# Patient Record
Sex: Female | Born: 1960
Health system: Southern US, Community
[De-identification: ages and names within clinical notes are randomized; demographics above are authoritative.]

## PROBLEM LIST (undated history)

## (undated) DIAGNOSIS — D219 Benign neoplasm of connective and other soft tissue, unspecified: Secondary | ICD-10-CM

## (undated) DIAGNOSIS — J302 Other seasonal allergic rhinitis: Secondary | ICD-10-CM

## (undated) HISTORY — PX: WISDOM TOOTH EXTRACTION: SHX21

## (undated) HISTORY — DX: Benign neoplasm of connective and other soft tissue, unspecified: D21.9

## (undated) HISTORY — PX: TUBAL LIGATION: SHX77

## (undated) HISTORY — DX: Other seasonal allergic rhinitis: J30.2

---

## 1997-08-27 ENCOUNTER — Other Ambulatory Visit: Admission: RE | Admit: 1997-08-27 | Discharge: 1997-08-27 | Payer: Self-pay | Admitting: Obstetrics and Gynecology

## 1998-05-25 ENCOUNTER — Emergency Department (HOSPITAL_COMMUNITY): Admission: EM | Admit: 1998-05-25 | Discharge: 1998-05-25 | Payer: Self-pay

## 1999-05-24 ENCOUNTER — Other Ambulatory Visit: Admission: RE | Admit: 1999-05-24 | Discharge: 1999-05-24 | Payer: Self-pay | Admitting: Obstetrics and Gynecology

## 2001-03-06 ENCOUNTER — Other Ambulatory Visit: Admission: RE | Admit: 2001-03-06 | Discharge: 2001-03-06 | Payer: Self-pay | Admitting: Obstetrics and Gynecology

## 2002-08-25 ENCOUNTER — Other Ambulatory Visit: Admission: RE | Admit: 2002-08-25 | Discharge: 2002-08-25 | Payer: Self-pay | Admitting: Obstetrics and Gynecology

## 2012-03-12 ENCOUNTER — Ambulatory Visit (INDEPENDENT_AMBULATORY_CARE_PROVIDER_SITE_OTHER): Payer: BC Managed Care – PPO | Admitting: Obstetrics and Gynecology

## 2012-03-12 ENCOUNTER — Encounter: Payer: Self-pay | Admitting: Obstetrics and Gynecology

## 2012-03-12 VITALS — BP 106/72 | HR 78 | Resp 16 | Ht 68.0 in | Wt 214.0 lb

## 2012-03-12 DIAGNOSIS — Z124 Encounter for screening for malignant neoplasm of cervix: Secondary | ICD-10-CM

## 2012-03-12 DIAGNOSIS — D219 Benign neoplasm of connective and other soft tissue, unspecified: Secondary | ICD-10-CM | POA: Insufficient documentation

## 2012-03-12 DIAGNOSIS — Z01419 Encounter for gynecological examination (general) (routine) without abnormal findings: Secondary | ICD-10-CM

## 2012-03-12 NOTE — Progress Notes (Signed)
ANNUAL GYNECOLOGIC EXAMINATION   Destiny Frost is a 51 y.o. female, G4P4, who presents for an annual exam. The patient is doing well.  She has not been seen in 5 years.  She has some insomnia but very few hot flashes.  She does have some vaginal dryness.  She complains of urinary urgency but no incontinence.   History   Social History  . Marital Status: Married    Spouse Name: N/A    Number of Children: N/A  . Years of Education: N/A   Social History Main Topics  . Smoking status: Never Smoker   . Smokeless tobacco: Never Used  . Alcohol Use: No  . Drug Use: No  . Sexually Active: Yes -- Female partner(s)    Birth Control/ Protection: Surgical   Other Topics Concern  . None   Social History Narrative  . None    Menstrual cycle:   LMP: Patient's last menstrual period was 03/11/2012.             The following portions of the patient's history were reviewed and updated as appropriate: allergies, current medications, past family history, past medical history, past social history, past surgical history and problem list.  Review of Systems Pertinent items are noted in HPI. Breast:Negative for breast lump,nipple discharge or nipple retraction Gastrointestinal: Negative for abdominal pain, change in bowel habits or rectal bleeding Urinary:negative   Objective:    BP 106/72  Pulse 78  Resp 16  Ht 5\' 8"  (1.727 m)  Wt 214 lb (97.07 kg)  BMI 32.54 kg/m2  LMP 03/11/2012    Weight:  Wt Readings from Last 1 Encounters:  03/12/12 214 lb (97.07 kg)          BMI: Body mass index is 32.54 kg/(m^2).  General Appearance: Alert, appropriate appearance for age. No acute distress HEENT: Grossly normal Neck / Thyroid: Supple, no masses, nodes or enlargement Lungs: clear to auscultation bilaterally Back: No CVA tenderness Breast Exam: No masses or nodes.No dimpling, nipple retraction or discharge. Cardiovascular: Regular rate and rhythm. S1, S2, no murmur Gastrointestinal: Soft,  non-tender, no masses or organomegaly  ++++++++++++++++++++++++++++++++++++++++++++++++++++++++  Pelvic Exam: External genitalia: normal general appearance Vaginal: normal without tenderness, induration or masses. Relaxation: Yes Cervix: normal appearance Adnexa: normal bimanual exam Uterus: normal size, shape, and consistency Rectovaginal: normal rectal, no masses  ++++++++++++++++++++++++++++++++++++++++++++++++++++++++  Lymphatic Exam: Non-palpable nodes in neck, clavicular, axillary, or inguinal regions Neurologic: Normal speech, no tremor  Psychiatric: Alert and oriented, appropriate affect.  Assessment:    Normal gyn exam   Overweight or obese: Yes   Pelvic relaxation: Yes  Few menopausal symptoms   Plan:    mammogram pap smear return annually or prn Contraception:bilateral tubal ligation    Medications prescribed: none  STD screen request: No   The updated Pap smear screening guidelines were discussed with the patient. The patient requested that I obtain a Pap smear: Yes.  Kegel exercises discussed: Yes.  Proper diet and regular exercise were reviewed.  Annual mammograms recommended starting at age 54. Proper breast care was discussed.  Screening colonoscopy is recommended beginning at age 32.  Regular health maintenance was reviewed.  Sleep hygiene was discussed.  Adequate calcium and vitamin D intake was emphasized.  Leonard Schwartz M.D.    Regular Periods: no "Since last Jan. Cycles have been off. Mammogram: yes  Monthly Breast Ex.: yes Exercise: yes  Tetanus < 10 years: yes Seatbelts: yes  NI. Bladder Functn.: yes Abuse at home: no  Daily BM's: yes Stressful Work: no  Healthy Diet: yes Sigmoid-Colonoscopy: Never  Calcium: no Medical problems this year: None per pt    LAST PAP:pt is unsure possible 2007 WNL  Contraception: BTL  Mammogram:  01/31/2012 'WNL"  PCP: No PCP  PMH: No Changes  FMH: No Changes  Last Bone Scan:  Never

## 2012-03-14 LAB — PAP IG W/ RFLX HPV ASCU

## 2012-11-15 ENCOUNTER — Other Ambulatory Visit: Payer: Self-pay | Admitting: Neurological Surgery

## 2012-11-15 DIAGNOSIS — M47816 Spondylosis without myelopathy or radiculopathy, lumbar region: Secondary | ICD-10-CM

## 2012-11-19 ENCOUNTER — Ambulatory Visit
Admission: RE | Admit: 2012-11-19 | Discharge: 2012-11-19 | Disposition: A | Discharge status: Home / Self Care | Payer: BC Managed Care – PPO | Source: Ambulatory Visit | Attending: Neurological Surgery | Admitting: Neurological Surgery

## 2012-11-19 VITALS — BP 131/65 | HR 53

## 2012-11-19 DIAGNOSIS — M47816 Spondylosis without myelopathy or radiculopathy, lumbar region: Secondary | ICD-10-CM

## 2012-11-19 DIAGNOSIS — M5126 Other intervertebral disc displacement, lumbar region: Secondary | ICD-10-CM

## 2012-11-19 MED ORDER — IOHEXOL 180 MG/ML  SOLN
1.0000 mL | Freq: Once | INTRAMUSCULAR | Status: AC | PRN
  Administered 2012-11-19: 1 mL via EPIDURAL

## 2012-11-19 MED ORDER — METHYLPREDNISOLONE ACETATE 40 MG/ML INJ SUSP (RADIOLOG
120.0000 mg | Freq: Once | INTRAMUSCULAR | Status: AC
  Administered 2012-11-19: 120 mg via EPIDURAL

## 2012-12-05 LAB — HM COLONOSCOPY

## 2012-12-13 LAB — HM COLONOSCOPY

## 2014-03-02 ENCOUNTER — Encounter: Payer: Self-pay | Admitting: Obstetrics and Gynecology

## 2014-05-01 HISTORY — PX: MOHS SURGERY: SUR867

## 2016-06-20 DIAGNOSIS — D2271 Melanocytic nevi of right lower limb, including hip: Secondary | ICD-10-CM | POA: Diagnosis not present

## 2016-06-20 DIAGNOSIS — D225 Melanocytic nevi of trunk: Secondary | ICD-10-CM | POA: Diagnosis not present

## 2016-06-20 DIAGNOSIS — D2230 Melanocytic nevi of unspecified part of face: Secondary | ICD-10-CM | POA: Diagnosis not present

## 2016-06-20 DIAGNOSIS — Z85828 Personal history of other malignant neoplasm of skin: Secondary | ICD-10-CM | POA: Diagnosis not present

## 2016-08-29 DIAGNOSIS — Z6832 Body mass index (BMI) 32.0-32.9, adult: Secondary | ICD-10-CM | POA: Diagnosis not present

## 2016-08-29 DIAGNOSIS — Z1231 Encounter for screening mammogram for malignant neoplasm of breast: Secondary | ICD-10-CM | POA: Diagnosis not present

## 2016-08-29 DIAGNOSIS — Z124 Encounter for screening for malignant neoplasm of cervix: Secondary | ICD-10-CM | POA: Diagnosis not present

## 2016-08-29 DIAGNOSIS — Z01419 Encounter for gynecological examination (general) (routine) without abnormal findings: Secondary | ICD-10-CM | POA: Diagnosis not present

## 2016-08-29 LAB — HM PAP SMEAR: HM PAP: NEGATIVE

## 2016-10-02 DIAGNOSIS — Z1231 Encounter for screening mammogram for malignant neoplasm of breast: Secondary | ICD-10-CM | POA: Diagnosis not present

## 2016-10-17 DIAGNOSIS — R922 Inconclusive mammogram: Secondary | ICD-10-CM | POA: Diagnosis not present

## 2016-10-17 DIAGNOSIS — N6002 Solitary cyst of left breast: Secondary | ICD-10-CM | POA: Diagnosis not present

## 2016-10-17 LAB — HM MAMMOGRAPHY

## 2016-10-18 DIAGNOSIS — D485 Neoplasm of uncertain behavior of skin: Secondary | ICD-10-CM | POA: Diagnosis not present

## 2016-10-18 DIAGNOSIS — C44619 Basal cell carcinoma of skin of left upper limb, including shoulder: Secondary | ICD-10-CM | POA: Diagnosis not present

## 2016-11-08 DIAGNOSIS — C44619 Basal cell carcinoma of skin of left upper limb, including shoulder: Secondary | ICD-10-CM | POA: Diagnosis not present

## 2016-12-05 ENCOUNTER — Encounter: Payer: Self-pay | Admitting: Podiatry

## 2016-12-05 ENCOUNTER — Ambulatory Visit (INDEPENDENT_AMBULATORY_CARE_PROVIDER_SITE_OTHER): Payer: BLUE CROSS/BLUE SHIELD | Admitting: Podiatry

## 2016-12-05 DIAGNOSIS — R21 Rash and other nonspecific skin eruption: Secondary | ICD-10-CM

## 2016-12-05 DIAGNOSIS — B353 Tinea pedis: Secondary | ICD-10-CM

## 2016-12-05 MED ORDER — TRIAMCINOLONE ACETONIDE 0.025 % EX OINT: 1.0000 "application " | TOPICAL_OINTMENT | Freq: Two times a day (BID) | CUTANEOUS | 0 refills | Status: DC

## 2016-12-05 MED ORDER — TERBINAFINE HCL 250 MG PO TABS: 250.0000 mg | ORAL_TABLET | Freq: Every day | ORAL | 0 refills | Status: DC

## 2016-12-05 NOTE — Progress Notes (Signed)
   Subjective:    Patient ID: Destiny Frost, female    DOB: Dec 04, 1960, 56 y.o.   MRN: [id number]  HPI  56 year old female presents the also concerns of a blister on the bottom of her foot with a right side worse than left which does H. She has noticed his little blisters form the bottom of her feet. She said that she may also have athlete's foot. She did see a dermatologist had tests done and she was told "it was nothing" however she states that the areas to hurt in the blisters forming it does itch. She states that it does crack, bleeding at times. She has been using over-the-counter hydrocortisone cream which is been helping last couple of days. She has no other concerns today.  Review of Systems  All other systems reviewed and are negative.      Objective:   Physical Exam General: AAO x3, NAD  Dermatological: On the plantar aspect of the right foot on the midfoot centrally is an area of annular hyperkeratotic tissue with dry skin overlying the area. There is also small erythematous areas on the plantar aspect of the foot which subjectively to age. There is no open sores or drainage identified today. The right fifth toenail does appear to be dystrophic, discolored with yellow discoloration. There is no open lesions of the foot otherwise.  Vascular: Dorsalis Pedis artery and Posterior Tibial artery pedal pulses are 2/4 bilateral with immedate capillary fill time.  There is no pain with calf compression, swelling, warmth, erythema.   Neruologic: Grossly intact via light touch bilateral.  Protective threshold with Semmes Wienstein monofilament intact to all pedal sites bilateral.  Musculoskeletal: No gross boney pedal deformities bilateral. No pain, crepitus, or limitation noted with foot and ankle range of motion bilateral. Muscular strength 5/5 in all groups tested bilateral.  Gait: Unassisted, Nonantalgic.     Assessment & Plan:  56 year old female with skin rash right foot likely tinea  pedis -Treatment options discussed including all alternatives, risks, and complications -Etiology of symptoms were discussed -Prescribed 2 weeks of Lamisil. Discussed side effects the medication. -Prescribed triamcinolone cream as well -Discussed measures to change shoes on a daily basis and keep his showers and she was cleaned. -Discussed possible long-term Lamisil for the right fifth digit toenail however due to adductovarus the toe this is more from damage and believe as opposed to just straight fungus. She may consider treatment options in the future for this. -RTC 3-4 weeks or sooner if needed.  Celesta Gentile, DPM

## 2016-12-19 DIAGNOSIS — D225 Melanocytic nevi of trunk: Secondary | ICD-10-CM | POA: Diagnosis not present

## 2016-12-19 DIAGNOSIS — Z808 Family history of malignant neoplasm of other organs or systems: Secondary | ICD-10-CM | POA: Diagnosis not present

## 2016-12-19 DIAGNOSIS — D220 Melanocytic nevi of lip: Secondary | ICD-10-CM | POA: Diagnosis not present

## 2016-12-19 DIAGNOSIS — Z85828 Personal history of other malignant neoplasm of skin: Secondary | ICD-10-CM | POA: Diagnosis not present

## 2017-01-02 ENCOUNTER — Encounter: Payer: Self-pay | Admitting: Podiatry

## 2017-01-02 ENCOUNTER — Ambulatory Visit (INDEPENDENT_AMBULATORY_CARE_PROVIDER_SITE_OTHER): Payer: BLUE CROSS/BLUE SHIELD | Admitting: Podiatry

## 2017-01-02 DIAGNOSIS — B351 Tinea unguium: Secondary | ICD-10-CM | POA: Diagnosis not present

## 2017-01-02 DIAGNOSIS — B353 Tinea pedis: Secondary | ICD-10-CM

## 2017-01-02 DIAGNOSIS — R21 Rash and other nonspecific skin eruption: Secondary | ICD-10-CM

## 2017-01-02 MED ORDER — KETOCONAZOLE 2 % EX CREA: 1.0000 "application " | TOPICAL_CREAM | Freq: Every day | CUTANEOUS | 2 refills | Status: DC

## 2017-01-02 NOTE — Progress Notes (Signed)
Subjective: 56 year old female presents the office today for follow-up evaluation of rash, likely tinea pedis the right foot. She finished her course of Lamisil and she states that she has not noticed any blisters or bumps form. She still gets some occasional pinching and she's been using a steroid cream. She denies any swelling or redness trephine she denies any open sores. She states that 3 weeks ago she was in [place] and she got bit by fire ants and this is slowly been improving. She did not seek treatment at that point. Denies any systemic complaints such as fevers, chills, nausea, vomiting. No acute changes since last appointment, and no other complaints at this time.   Objective: AAO x3, NAD DP/PT pulses palpable bilaterally, CRT less than 3 seconds Small little bumps or present to the back of the ankle metopic the foot although minimal from where she got bit by fire ants. There is no drainage or swelling or signs of infection. To the plantar aspect of the foot on the arch continues to be as small area dry skin which is peeling with resolving tinea pedis. There is no blistering present. There is no swelling. Is no open lesions. The right fifth digit toenail disc appeared dystrophic, discolored with yellow to brown discoloration. This is been ongoing and she was 56 years old. There is no edema, erythema, drainage past medical clinical signs of infection present. No open lesions or pre-ulcerative lesions.  No pain with calf compression, swelling, warmth, erythema  Assessment: Tinea pedis which is resolving, skin rash  Plan: -All treatment options discussed with the patient including all alternatives, risks, complications.  -At this point would hold off on any further oral Lamisil. I prescribed topical ketoconazole cream to help finish clearing the infection. -Debrided the right fifth digit toenail this was sent for cultures/pathology to [facility] labs -Follow-up after nail culture. Discuss possible  continuation of Lamisil if there is fungus although I still believe this is a component of nail damage. Discussed possible urea cream if she wishes if negative. -Patient encouraged to call the office with any questions, concerns, change in symptoms.   Celesta Gentile, DPM

## 2017-01-12 ENCOUNTER — Telehealth: Payer: Self-pay | Admitting: *Deleted

## 2017-01-12 DIAGNOSIS — Z79899 Other long term (current) drug therapy: Secondary | ICD-10-CM

## 2017-01-12 NOTE — Telephone Encounter (Addendum)
-----   Message from [name], DPM sent at 01/12/2017 12:55 PM EDT ----- + fungus- please let her know. We can do 3 months of the lamisil. She was only on a short course before but will need to do CBC and LFT prior. I still think there is a component of damage to it so medication is not a guarantee to resolve it but should help. Please let her know. Thank you. Left message informing pt to call for culture results.01/18/2017-Pt called for lab results. I informed pt of Dr. Leigh Aurora review of results and orders, return in 4-5 weeks for retest. Pt states understanding and orders sent to [facility] at [facility], and lamisil to [facility].

## 2017-01-18 ENCOUNTER — Encounter: Payer: Self-pay | Admitting: Family Medicine

## 2017-01-18 MED ORDER — TERBINAFINE HCL 250 MG PO TABS: 250.0000 mg | ORAL_TABLET | Freq: Every day | ORAL | 0 refills | Status: DC

## 2017-01-25 DIAGNOSIS — Z79899 Other long term (current) drug therapy: Secondary | ICD-10-CM | POA: Diagnosis not present

## 2017-01-26 LAB — CBC WITH DIFFERENTIAL/PLATELET
BASOS: 1 %
Basophils Absolute: 0 10*3/uL (ref 0.0–0.2)
EOS (ABSOLUTE): 0.1 10*3/uL (ref 0.0–0.4)
EOS: 2 %
HEMOGLOBIN: 13.6 g/dL (ref 11.1–15.9)
Hematocrit: 40 % (ref 34.0–46.6)
IMMATURE GRANULOCYTES: 0 %
Immature Grans (Abs): 0 10*3/uL (ref 0.0–0.1)
LYMPHS ABS: 1.6 10*3/uL (ref 0.7–3.1)
Lymphs: 28 %
MCH: 30.2 pg (ref 26.6–33.0)
MCHC: 34 g/dL (ref 31.5–35.7)
MCV: 89 fL (ref 79–97)
MONOCYTES: 8 %
MONOS ABS: 0.4 10*3/uL (ref 0.1–0.9)
Neutrophils Absolute: 3.5 10*3/uL (ref 1.4–7.0)
Neutrophils: 61 %
Platelets: 233 10*3/uL (ref 150–379)
RBC: 4.5 x10E6/uL (ref 3.77–5.28)
RDW: 13.4 % (ref 12.3–15.4)
WBC: 5.6 10*3/uL (ref 3.4–10.8)

## 2017-01-26 LAB — HEPATIC FUNCTION PANEL
ALBUMIN: 4.9 g/dL (ref 3.5–5.5)
ALK PHOS: 57 IU/L (ref 39–117)
ALT: 15 IU/L (ref 0–32)
AST: 18 IU/L (ref 0–40)
BILIRUBIN, DIRECT: 0.11 mg/dL (ref 0.00–0.40)
Bilirubin Total: 0.5 mg/dL (ref 0.0–1.2)
TOTAL PROTEIN: 7.6 g/dL (ref 6.0–8.5)

## 2017-01-30 ENCOUNTER — Telehealth: Payer: Self-pay | Admitting: *Deleted

## 2017-01-30 NOTE — Telephone Encounter (Addendum)
-----   Message from [name], DPM sent at 01/30/2017  3:07 PM EDT ----- Val- Please let her know that her blood work is normal and it is OK to continue Lamisil. We will do this for a total of 3 months. Thanks. Left message informing pt of Dr. Leigh Aurora review of results and orders.

## 2017-03-08 ENCOUNTER — Encounter: Payer: Self-pay | Admitting: Podiatry

## 2017-05-09 ENCOUNTER — Telehealth: Payer: Self-pay | Admitting: *Deleted

## 2017-05-09 DIAGNOSIS — Z79899 Other long term (current) drug therapy: Secondary | ICD-10-CM

## 2017-05-09 NOTE — Telephone Encounter (Signed)
Pt states she thought she was to have lab

## 2017-05-15 NOTE — Telephone Encounter (Signed)
Pt called for location of [facility].

## 2017-05-15 NOTE — Telephone Encounter (Signed)
I spoke with pt and she states she had her labs performed in [facility] previously. I told pt she could have them performed there again. Pt asked for the phone to the [place] closest to our office, I gave [phone].

## 2017-05-16 DIAGNOSIS — Z79899 Other long term (current) drug therapy: Secondary | ICD-10-CM | POA: Diagnosis not present

## 2017-05-17 LAB — CBC WITH DIFFERENTIAL/PLATELET
BASOS ABS: 0 10*3/uL (ref 0.0–0.2)
Basos: 0 %
EOS (ABSOLUTE): 0.1 10*3/uL (ref 0.0–0.4)
Eos: 1 %
HEMOGLOBIN: 12.8 g/dL (ref 11.1–15.9)
Hematocrit: 38.4 % (ref 34.0–46.6)
IMMATURE GRANS (ABS): 0 10*3/uL (ref 0.0–0.1)
Immature Granulocytes: 0 %
LYMPHS ABS: 1.6 10*3/uL (ref 0.7–3.1)
LYMPHS: 34 %
MCH: 30.2 pg (ref 26.6–33.0)
MCHC: 33.3 g/dL (ref 31.5–35.7)
MCV: 91 fL (ref 79–97)
Monocytes Absolute: 0.5 10*3/uL (ref 0.1–0.9)
Monocytes: 10 %
NEUTROS PCT: 55 %
Neutrophils Absolute: 2.6 10*3/uL (ref 1.4–7.0)
Platelets: 228 10*3/uL (ref 150–379)
RBC: 4.24 x10E6/uL (ref 3.77–5.28)
RDW: 13.3 % (ref 12.3–15.4)
WBC: 4.8 10*3/uL (ref 3.4–10.8)

## 2017-05-17 LAB — HEPATIC FUNCTION PANEL
ALT: 16 IU/L (ref 0–32)
AST: 20 IU/L (ref 0–40)
Albumin: 4.8 g/dL (ref 3.5–5.5)
Alkaline Phosphatase: 65 IU/L (ref 39–117)
Bilirubin Total: 0.6 mg/dL (ref 0.0–1.2)
Bilirubin, Direct: 0.15 mg/dL (ref 0.00–0.40)
Total Protein: 7.2 g/dL (ref 6.0–8.5)

## 2017-05-21 ENCOUNTER — Telehealth: Payer: Self-pay | Admitting: *Deleted

## 2017-05-21 NOTE — Telephone Encounter (Signed)
-----   Message from [name], DPM sent at 05/18/2017  4:36 PM EST ----- Blood work is normal- can continue Lamisil. Please let her know. She should finish a total of 3 months and if there is still issue after to schedule a follow-up or if she has any issues.

## 2017-05-21 NOTE — Telephone Encounter (Signed)
I informed pt of Dr. Wagoner's review of results and orders. 

## 2017-06-14 DIAGNOSIS — H5203 Hypermetropia, bilateral: Secondary | ICD-10-CM | POA: Diagnosis not present

## 2017-06-14 DIAGNOSIS — H52223 Regular astigmatism, bilateral: Secondary | ICD-10-CM | POA: Diagnosis not present

## 2017-06-14 DIAGNOSIS — H524 Presbyopia: Secondary | ICD-10-CM | POA: Diagnosis not present

## 2017-06-15 DIAGNOSIS — Z23 Encounter for immunization: Secondary | ICD-10-CM | POA: Diagnosis not present

## 2017-07-04 DIAGNOSIS — N3001 Acute cystitis with hematuria: Secondary | ICD-10-CM | POA: Diagnosis not present

## 2017-08-14 DIAGNOSIS — M5431 Sciatica, right side: Secondary | ICD-10-CM | POA: Diagnosis not present

## 2017-08-21 DIAGNOSIS — M5431 Sciatica, right side: Secondary | ICD-10-CM | POA: Diagnosis not present

## 2017-08-30 DIAGNOSIS — M5431 Sciatica, right side: Secondary | ICD-10-CM | POA: Diagnosis not present

## 2017-09-10 DIAGNOSIS — M5431 Sciatica, right side: Secondary | ICD-10-CM | POA: Diagnosis not present

## 2017-10-10 DIAGNOSIS — M9903 Segmental and somatic dysfunction of lumbar region: Secondary | ICD-10-CM | POA: Diagnosis not present

## 2017-10-10 DIAGNOSIS — M5442 Lumbago with sciatica, left side: Secondary | ICD-10-CM | POA: Diagnosis not present

## 2017-10-10 DIAGNOSIS — M9905 Segmental and somatic dysfunction of pelvic region: Secondary | ICD-10-CM | POA: Diagnosis not present

## 2017-10-10 DIAGNOSIS — M25552 Pain in left hip: Secondary | ICD-10-CM | POA: Diagnosis not present

## 2017-10-11 DIAGNOSIS — M25552 Pain in left hip: Secondary | ICD-10-CM | POA: Diagnosis not present

## 2017-10-11 DIAGNOSIS — M9905 Segmental and somatic dysfunction of pelvic region: Secondary | ICD-10-CM | POA: Diagnosis not present

## 2017-10-11 DIAGNOSIS — M9903 Segmental and somatic dysfunction of lumbar region: Secondary | ICD-10-CM | POA: Diagnosis not present

## 2017-10-11 DIAGNOSIS — M5442 Lumbago with sciatica, left side: Secondary | ICD-10-CM | POA: Diagnosis not present

## 2017-10-15 DIAGNOSIS — M5442 Lumbago with sciatica, left side: Secondary | ICD-10-CM | POA: Diagnosis not present

## 2017-10-15 DIAGNOSIS — M25552 Pain in left hip: Secondary | ICD-10-CM | POA: Diagnosis not present

## 2017-10-15 DIAGNOSIS — M9905 Segmental and somatic dysfunction of pelvic region: Secondary | ICD-10-CM | POA: Diagnosis not present

## 2017-10-15 DIAGNOSIS — M9903 Segmental and somatic dysfunction of lumbar region: Secondary | ICD-10-CM | POA: Diagnosis not present

## 2017-10-16 DIAGNOSIS — M9903 Segmental and somatic dysfunction of lumbar region: Secondary | ICD-10-CM | POA: Diagnosis not present

## 2017-10-16 DIAGNOSIS — M5442 Lumbago with sciatica, left side: Secondary | ICD-10-CM | POA: Diagnosis not present

## 2017-10-16 DIAGNOSIS — M9905 Segmental and somatic dysfunction of pelvic region: Secondary | ICD-10-CM | POA: Diagnosis not present

## 2017-10-16 DIAGNOSIS — M25552 Pain in left hip: Secondary | ICD-10-CM | POA: Diagnosis not present

## 2017-10-18 DIAGNOSIS — M9905 Segmental and somatic dysfunction of pelvic region: Secondary | ICD-10-CM | POA: Diagnosis not present

## 2017-10-18 DIAGNOSIS — M9903 Segmental and somatic dysfunction of lumbar region: Secondary | ICD-10-CM | POA: Diagnosis not present

## 2017-10-18 DIAGNOSIS — M25552 Pain in left hip: Secondary | ICD-10-CM | POA: Diagnosis not present

## 2017-10-18 DIAGNOSIS — M5442 Lumbago with sciatica, left side: Secondary | ICD-10-CM | POA: Diagnosis not present

## 2017-10-22 DIAGNOSIS — M9903 Segmental and somatic dysfunction of lumbar region: Secondary | ICD-10-CM | POA: Diagnosis not present

## 2017-10-22 DIAGNOSIS — M25552 Pain in left hip: Secondary | ICD-10-CM | POA: Diagnosis not present

## 2017-10-22 DIAGNOSIS — M5442 Lumbago with sciatica, left side: Secondary | ICD-10-CM | POA: Diagnosis not present

## 2017-10-22 DIAGNOSIS — M9905 Segmental and somatic dysfunction of pelvic region: Secondary | ICD-10-CM | POA: Diagnosis not present

## 2017-10-22 NOTE — Progress Notes (Signed)
[facility] [address] [place], [place] [address] Phone: [phone] Subjective:    I'm seeing this patient by the request  of:  Martinique, Betty G, MD   CC: Left-sided back pain  DXI:[id number]  Destiny Frost is a 57 y.o. female coming in with complaint of left-sided back pain.  Seems to be more in the buttocks region patient has had it for multiple months.  Patient did have back pain on the contralateral side and appeared to be more of a piriformis syndrome.  This was many years ago.  Started having worsening symptoms.  Worse with sitting.  Some mild radiation down the leg.  Has been seeing a chiropractor that seems to be improving.     Past Medical History:  Diagnosis Date  . Fibroid    Past Surgical History:  Procedure Laterality Date  . TUBAL LIGATION    . WISDOM TOOTH EXTRACTION     Social History   Socioeconomic History  . Marital status: Married    Spouse name: Not on file  . Number of children: Not on file  . Years of education: Not on file  . Highest education level: Not on file  Occupational History  . Not on file  Social Needs  . Financial resource strain: Not on file  . Food insecurity:    Worry: Not on file    Inability: Not on file  . Transportation needs:    Medical: Not on file    Non-medical: Not on file  Tobacco Use  . Smoking status: Never Smoker  . Smokeless tobacco: Never Used  Substance and Sexual Activity  . Alcohol use: No  . Drug use: No  . Sexual activity: Yes    Partners: Male    Birth control/protection: Surgical  Lifestyle  . Physical activity:    Days per week: Not on file    Minutes per session: Not on file  . Stress: Not on file  Relationships  . Social connections:    Talks on phone: Not on file    Gets together: Not on file    Attends religious service: Not on file    Active member of club or organization: Not on file    Attends meetings of clubs or organizations: Not on file   Relationship status: Not on file  Other Topics Concern  . Not on file  Social History Narrative  . Not on file   Allergies  Allergen Reactions  . Codeine    Family History  Problem Relation Age of Onset  . Cancer Father   . Hypertension Mother   . Cancer Mother        SKIN     Past medical history, social, surgical and family history all reviewed in electronic medical record.  No pertanent information unless stated regarding to the chief complaint.   Review of Systems:Review of systems updated and as accurate as of 10/23/17  No headache, visual changes, nausea, vomiting, diarrhea, constipation, dizziness, abdominal pain, skin rash, fevers, chills, night sweats, weight loss, swollen lymph nodes, body aches, joint swelling, muscle aches, chest pain, shortness of breath, mood changes.   Objective  Blood pressure 116/74, pulse 80, height 5\' 8"  (1.727 m), weight 218 lb (98.9 kg), SpO2 98 %. Systems examined below as of 10/23/17   General: No apparent distress alert and oriented x3 mood and affect normal, dressed appropriately.  HEENT: Pupils equal, extraocular movements intact  Respiratory: Patient's speak in full sentences and  does not appear short of breath  Cardiovascular: No lower extremity edema, non tender, no erythema  Skin: Warm dry intact with no signs of infection or rash on extremities or on axial skeleton.  Abdomen: Soft nontender  Neuro: Cranial nerves II through XII are intact, neurovascularly intact in all extremities with 2+ DTRs and 2+ pulses.  Lymph: No lymphadenopathy of posterior or anterior cervical chain or axillae bilaterally.  Gait normal with good balance and coordination.  MSK:  Non tender with full range of motion and good stability and symmetric strength and tone of shoulders, elbows, wrist, hip, knee and ankles bilaterally.  Back Exam:  Inspection: Loss of lordosis Motion: Flexion 35 deg, Extension 25 deg, Side Bending to 35 deg bilaterally,  Rotation  to 25 deg bilaterally  SLR laying: Negative  XSLR laying: Negative  Palpable tenderness: Tender to palpation the paraspinal musculature lumbar spine but more over the piriformis. FABER: Positive on the left.  Severe tightness of the piriformis on the left compared to the right Sensory change: Gross sensation intact to all lumbar and sacral dermatomes.  Reflexes: 2+ at both patellar tendons, 2+ at achilles tendons, Babinski's downgoing.  Strength at foot  Plantar-flexion: 5/5 Dorsi-flexion: 5/5 Eversion: 5/5 Inversion: 5/5  Leg strength  Quad: 5/5 Hamstring: 5/5 Hip flexor: 5/5 Hip abductors: 4/5  Gait unremarkable.  [id number]; 15 additional minutes spent for Therapeutic exercises as stated in above notes.  This included exercises focusing on stretching, strengthening, with significant focus on eccentric aspects.   Long term goals include an improvement in range of motion, strength, endurance as well as avoiding reinjury. Patient's frequency would include in 1-2 times a day, 3-5 times a week for a duration of 6-12 weeks.Low back exercises that included:  Pelvic tilt/bracing instruction to focus on control of the pelvic girdle and lower abdominal muscles  Glute strengthening exercises, focusing on proper firing of the glutes without engaging the low back muscles Proper stretching techniques for maximum relief for the hamstrings, hip flexors, low back and some rotation where tolerated   Proper technique shown and discussed handout in great detail with ATC.  All questions were discussed and answered.      Impression and Recommendations:     This case required medical decision making of moderate complexity.      Note: This dictation was prepared with Dragon dictation along with smaller phrase technology. Any transcriptional errors that result from this process are unintentional.

## 2017-10-23 ENCOUNTER — Ambulatory Visit (INDEPENDENT_AMBULATORY_CARE_PROVIDER_SITE_OTHER): Payer: BLUE CROSS/BLUE SHIELD | Admitting: Family Medicine

## 2017-10-23 ENCOUNTER — Encounter

## 2017-10-23 ENCOUNTER — Encounter: Payer: Self-pay | Admitting: Family Medicine

## 2017-10-23 DIAGNOSIS — M5410 Radiculopathy, site unspecified: Secondary | ICD-10-CM | POA: Insufficient documentation

## 2017-10-23 DIAGNOSIS — G5702 Lesion of sciatic nerve, left lower limb: Secondary | ICD-10-CM

## 2017-10-23 DIAGNOSIS — M25552 Pain in left hip: Secondary | ICD-10-CM | POA: Diagnosis not present

## 2017-10-23 DIAGNOSIS — M9903 Segmental and somatic dysfunction of lumbar region: Secondary | ICD-10-CM | POA: Diagnosis not present

## 2017-10-23 DIAGNOSIS — M5442 Lumbago with sciatica, left side: Secondary | ICD-10-CM | POA: Diagnosis not present

## 2017-10-23 DIAGNOSIS — M9905 Segmental and somatic dysfunction of pelvic region: Secondary | ICD-10-CM | POA: Diagnosis not present

## 2017-10-23 MED ORDER — GABAPENTIN 100 MG PO CAPS: 200.0000 mg | ORAL_CAPSULE | Freq: Every day | ORAL | 3 refills | Status: DC

## 2017-10-23 NOTE — Assessment & Plan Note (Signed)
Piriformis Syndrome  Using an anatomical model, reviewed with the patient the structures involved and how they related to diagnosis. The patient indicated understanding.   The patient was given a handout from Dr. Arne Cleveland book "The Sports Medicine Patient Advisor" describing the anatomy and rehabilitation of the following condition: Piriformis Syndrome  Also given a handout with more extensive Piriformis stretching, hip flexor and abductor strengthening, ham stretching  Rec deep massage, explained self-massage with ball Patient does not make improvement will consider possible formal physical therapy, injections, and possible osteopathic manipulation.

## 2017-10-23 NOTE — Patient Instructions (Signed)
Good to see you  Piriformis syndrome Ice 20 minutes 2 times daily. Usually after activity and before bed. Exercises 3 times a week.  Gabapentin 200mg  at night Tennis ball in back left pocket with sitting  See me again in 4 weeks

## 2017-10-25 DIAGNOSIS — M9903 Segmental and somatic dysfunction of lumbar region: Secondary | ICD-10-CM | POA: Diagnosis not present

## 2017-10-25 DIAGNOSIS — M5442 Lumbago with sciatica, left side: Secondary | ICD-10-CM | POA: Diagnosis not present

## 2017-10-25 DIAGNOSIS — M9905 Segmental and somatic dysfunction of pelvic region: Secondary | ICD-10-CM | POA: Diagnosis not present

## 2017-10-25 DIAGNOSIS — M25552 Pain in left hip: Secondary | ICD-10-CM | POA: Diagnosis not present

## 2017-10-31 DIAGNOSIS — M9903 Segmental and somatic dysfunction of lumbar region: Secondary | ICD-10-CM | POA: Diagnosis not present

## 2017-10-31 DIAGNOSIS — M9905 Segmental and somatic dysfunction of pelvic region: Secondary | ICD-10-CM | POA: Diagnosis not present

## 2017-10-31 DIAGNOSIS — M5442 Lumbago with sciatica, left side: Secondary | ICD-10-CM | POA: Diagnosis not present

## 2017-10-31 DIAGNOSIS — M5431 Sciatica, right side: Secondary | ICD-10-CM | POA: Diagnosis not present

## 2017-10-31 DIAGNOSIS — M25552 Pain in left hip: Secondary | ICD-10-CM | POA: Diagnosis not present

## 2017-11-05 DIAGNOSIS — M25552 Pain in left hip: Secondary | ICD-10-CM | POA: Diagnosis not present

## 2017-11-05 DIAGNOSIS — M5442 Lumbago with sciatica, left side: Secondary | ICD-10-CM | POA: Diagnosis not present

## 2017-11-05 DIAGNOSIS — M9903 Segmental and somatic dysfunction of lumbar region: Secondary | ICD-10-CM | POA: Diagnosis not present

## 2017-11-05 DIAGNOSIS — M9905 Segmental and somatic dysfunction of pelvic region: Secondary | ICD-10-CM | POA: Diagnosis not present

## 2017-11-06 DIAGNOSIS — M9905 Segmental and somatic dysfunction of pelvic region: Secondary | ICD-10-CM | POA: Diagnosis not present

## 2017-11-06 DIAGNOSIS — M25552 Pain in left hip: Secondary | ICD-10-CM | POA: Diagnosis not present

## 2017-11-06 DIAGNOSIS — M5442 Lumbago with sciatica, left side: Secondary | ICD-10-CM | POA: Diagnosis not present

## 2017-11-06 DIAGNOSIS — M9903 Segmental and somatic dysfunction of lumbar region: Secondary | ICD-10-CM | POA: Diagnosis not present

## 2017-11-08 DIAGNOSIS — M5442 Lumbago with sciatica, left side: Secondary | ICD-10-CM | POA: Diagnosis not present

## 2017-11-08 DIAGNOSIS — M9905 Segmental and somatic dysfunction of pelvic region: Secondary | ICD-10-CM | POA: Diagnosis not present

## 2017-11-08 DIAGNOSIS — M25552 Pain in left hip: Secondary | ICD-10-CM | POA: Diagnosis not present

## 2017-11-08 DIAGNOSIS — M9903 Segmental and somatic dysfunction of lumbar region: Secondary | ICD-10-CM | POA: Diagnosis not present

## 2017-11-12 DIAGNOSIS — M9903 Segmental and somatic dysfunction of lumbar region: Secondary | ICD-10-CM | POA: Diagnosis not present

## 2017-11-12 DIAGNOSIS — M5442 Lumbago with sciatica, left side: Secondary | ICD-10-CM | POA: Diagnosis not present

## 2017-11-12 DIAGNOSIS — M9905 Segmental and somatic dysfunction of pelvic region: Secondary | ICD-10-CM | POA: Diagnosis not present

## 2017-11-12 DIAGNOSIS — M25552 Pain in left hip: Secondary | ICD-10-CM | POA: Diagnosis not present

## 2017-11-13 DIAGNOSIS — M5431 Sciatica, right side: Secondary | ICD-10-CM | POA: Diagnosis not present

## 2017-11-14 DIAGNOSIS — M25552 Pain in left hip: Secondary | ICD-10-CM | POA: Diagnosis not present

## 2017-11-14 DIAGNOSIS — M5442 Lumbago with sciatica, left side: Secondary | ICD-10-CM | POA: Diagnosis not present

## 2017-11-14 DIAGNOSIS — M9905 Segmental and somatic dysfunction of pelvic region: Secondary | ICD-10-CM | POA: Diagnosis not present

## 2017-11-14 DIAGNOSIS — M9903 Segmental and somatic dysfunction of lumbar region: Secondary | ICD-10-CM | POA: Diagnosis not present

## 2017-11-19 DIAGNOSIS — M5442 Lumbago with sciatica, left side: Secondary | ICD-10-CM | POA: Diagnosis not present

## 2017-11-19 DIAGNOSIS — M9903 Segmental and somatic dysfunction of lumbar region: Secondary | ICD-10-CM | POA: Diagnosis not present

## 2017-11-19 DIAGNOSIS — M25552 Pain in left hip: Secondary | ICD-10-CM | POA: Diagnosis not present

## 2017-11-19 DIAGNOSIS — M9905 Segmental and somatic dysfunction of pelvic region: Secondary | ICD-10-CM | POA: Diagnosis not present

## 2017-11-20 ENCOUNTER — Ambulatory Visit (INDEPENDENT_AMBULATORY_CARE_PROVIDER_SITE_OTHER): Payer: BLUE CROSS/BLUE SHIELD | Admitting: Sports Medicine

## 2017-11-20 ENCOUNTER — Ambulatory Visit: Payer: BLUE CROSS/BLUE SHIELD | Admitting: Family Medicine

## 2017-11-20 ENCOUNTER — Encounter: Payer: Self-pay | Admitting: Sports Medicine

## 2017-11-20 VITALS — BP 112/82 | HR 65 | Ht 68.0 in | Wt 212.8 lb

## 2017-11-20 DIAGNOSIS — M4726 Other spondylosis with radiculopathy, lumbar region: Secondary | ICD-10-CM

## 2017-11-20 DIAGNOSIS — M24552 Contracture, left hip: Secondary | ICD-10-CM

## 2017-11-20 DIAGNOSIS — M5410 Radiculopathy, site unspecified: Secondary | ICD-10-CM

## 2017-11-20 DIAGNOSIS — R29898 Other symptoms and signs involving the musculoskeletal system: Secondary | ICD-10-CM | POA: Diagnosis not present

## 2017-11-20 MED ORDER — METHYLPREDNISOLONE ACETATE 80 MG/ML IJ SUSP
80.0000 mg | Freq: Once | INTRAMUSCULAR | Status: AC
  Administered 2017-11-20: 80 mg via INTRAMUSCULAR

## 2017-11-20 MED ORDER — GABAPENTIN 300 MG PO CAPS
300.0000 mg | ORAL_CAPSULE | Freq: Three times a day (TID) | ORAL | 2 refills | Status: DC
Start: 2017-11-20 — End: 2017-12-11

## 2017-11-20 MED ORDER — KETOROLAC TROMETHAMINE 60 MG/2ML IM SOLN
60.0000 mg | Freq: Once | INTRAMUSCULAR | Status: AC
  Administered 2017-11-20: 60 mg via INTRAMUSCULAR

## 2017-11-20 NOTE — Progress Notes (Signed)
[name]. Musette Kisamore, [place] at [facility] [place] - 57 y.o. female MRN [id number]  Date of birth: June 11, 1960  Visit Date: 11/20/2017  PCP: Martinique, Betty G, MD   Referred by: Martinique, Betty G, MD  Scribe(s) for today's visit: Josepha Pigg, CMA  SUBJECTIVE:  Destiny Frost is here for Initial Assessment (L gluteal pain) Referred directly by Dr. Jola Baptist, DC  11/20/2017: Her L gluteal pain symptoms INITIALLY: Began mid April 2019 and she has hx of similar issue.  Described as moderate nagging aching, radiating to the L LE. Occasionally the pain will radiate to the L heel. She did notice sharp shooting pains in her leg the other night but it has resolved.  Worsened with sitting, stretching Improved with standing. Walking was helping but hasn't over the past 2 weeks.  Additional associated symptoms include: Had XR L-spine with Dr. Jimmye Norman. She reports weakness in both hips.    At this time symptoms are worsening compared to onset. She has gotten injection and done PT in the past. She has been seeing Dr. Jimmye Norman (chiropractor) with some relief, dry needling. She was put on Gabapentin at bedtime with Dr. Tamala Julian but doesn't feel that it has been beneficial. She has tried IBU with some relief.    REVIEW OF SYSTEMS: Reports night time disturbances. Reports night sweats. Denies unexplained weight loss. Reports personal history of cancer, skin. Denies changes in bowel or bladder habits. Denies recent unreported falls. Denies new or worsening dyspnea or wheezing. Denies headaches or dizziness.  Reports numbness, tingling or weakness  In the extremities.  Denies dizziness or presyncopal episodes Denies lower extremity edema    HISTORY & PERTINENT PRIOR DATA:  Prior History reviewed and updated per electronic medical record.  Significant/pertinent history, findings, studies include:  reports that she has never  smoked. She has never used smokeless tobacco. No results for input(s): HGBA1C, LABURIC, CREATINE in the last 8760 hours. No specialty comments available. Problem  Radiculitis   MRI from 2014: 1. Right paracentral and lateral recess protrusion with caudal extension at L5-S1 results in encroachment on the descending right S1 root. 2. Shallow central protrusion L4-5 causes mild narrowing of the lateral recesses and central canal without nerve root compression      OBJECTIVE:  VS:  HT:5\' 8"  (172.7 cm)   WT:212 lb 12.8 oz (96.5 kg)  BMI:32.36    BP:112/82  HR:65bpm  TEMP: ( )  RESP:97 %   PHYSICAL EXAM: Constitutional: WDWN, Non-toxic appearing. Psychiatric: Alert & appropriately interactive.  Not depressed or anxious appearing. Respiratory: No increased work of breathing.  Trachea Midline Eyes: Pupils are equal.  EOM intact without nystagmus.  No scleral icterus  Vascular Exam: warm to touch no edema  lower extremity neuro exam: unremarkable normal strength normal sensation normal reflexes  MSK Exam: Positive straight leg raise on the left with pain with popliteal compression test and pain with palpation of the greater sciatic notch on the left greater than right.  She is able to heel toe walk without difficulty.  She has good internal rotation of bilateral hips.  She does have a pseudo-short left leg that is worsened in a seated position compared to a supine position.  Left ASIS compression test positive.  Hip abduction strength is 4 out of 5 strength with TFL predominant recruitment pattern.  Markedly tight left hip flexor.  ASSESSMENT & PLAN:   1. Osteoarthritis of spine with radiculopathy,  lumbar region   2. Radiculitis   3. Left hip flexor tightness   4. Weakness of both hips     PLAN: Case was directly discussed with Dr. Jola Baptist who is been treating her from chiropractic standpoint.  Reviewing her MRI from 2014 in addition she had a right-sided L5-S1 disc  and she reports the symptoms today are currently very similar just on the opposite side and states she actually has a slight radiation on the left that is causing radiculitis that is exacerbating her underlying left hip flexor tightness, piriformis syndrome and hip abduction weakness.  She has had only minimal symptom improvement on low-dose gabapentin I like to see how she does with further titration of this as well as with a Depo-Medrol and Toradol injection today.  We did discuss we have the option to refer her to Dr. Laurence Spates for an empiric epidural steroid injection and if any lack of improvement this is without recommend the next course of action.  Links to [employer] provided today per Patient Instructions.  These exercises were developed by Minerva Ends, DC with a strong emphasis on core neuromuscular reducation and postural realignment through body-weight exercises.  She will continue with chiropractic care, dry needling, and medication titration we will plan to check in with her in 4 weeks to check on her progress at that time.  Can consider further diagnostic evaluation with MRI of the lumbar spine but suspect this is coming more from a true double crush phenomenon from likely small herniation at the L5-S1 and piriformis syndrome  Follow-up: Return in about 1 month (around 12/18/2017) for repeat clinical exam.      Please see additional documentation for Objective, Assessment and Plan sections. Pertinent additional documentation may be included in corresponding procedure notes, imaging studies, problem based documentation and patient instructions. Please see these sections of the encounter for additional information regarding this visit.  CMA/ATC served as Education administrator during this visit. History, Physical, and Plan performed by medical provider. Documentation and orders reviewed and attested to.      Gerda Diss, [place] [facility] Physician

## 2017-11-20 NOTE — Patient Instructions (Signed)
Try taking 300 mg of gabapentin at nighttime.  Add in 100 mg in the morning and 100 mg at lunchtime as you can tolerate over the next 2 weeks.  It is okay to bump up to 300 mg 3 times per day if it is helping and you are not having any tiredness.   Also check out [employer]" which is a program developed by Dr. Minerva Ends.   There are links to a couple of his YouTube Videos below and I would like to see performing one of his videos 5-6 days per week.    A good intro video is: "Independence from Pain 7-minute Video" - [email or web address].com   Exercises that focus more on the neck are as below: Dr. Archie Balboa with [place] teaching neck and shoulder details Part 1 - https://youtu.be/[email or web address] Part 2 Dr. Archie Balboa with [facility] quick routine to practice daily - https://youtu.be/[email or web address]  Do not try to attempt the entire video when first beginning.    Try breaking of each exercise that he goes into shorter segments.  Otherwise if they perform an exercise for 45 seconds, start with 15 seconds and rest and then resume when they begin the new activity.  If you work your way up to being able to do these videos without having to stop, I expect you will see significant improvements in your pain.  If you enjoy his videos and would like to find out more you can look on his website: [email or web address]  He has a workout streaming option as well as a DVD set available for purchase.  Amazon has the best price for his DVDs.

## 2017-11-22 DIAGNOSIS — M5442 Lumbago with sciatica, left side: Secondary | ICD-10-CM | POA: Diagnosis not present

## 2017-11-22 DIAGNOSIS — M9905 Segmental and somatic dysfunction of pelvic region: Secondary | ICD-10-CM | POA: Diagnosis not present

## 2017-11-22 DIAGNOSIS — M25552 Pain in left hip: Secondary | ICD-10-CM | POA: Diagnosis not present

## 2017-11-22 DIAGNOSIS — M9903 Segmental and somatic dysfunction of lumbar region: Secondary | ICD-10-CM | POA: Diagnosis not present

## 2017-11-26 ENCOUNTER — Ambulatory Visit (INDEPENDENT_AMBULATORY_CARE_PROVIDER_SITE_OTHER): Payer: BLUE CROSS/BLUE SHIELD | Admitting: Sports Medicine

## 2017-11-26 ENCOUNTER — Encounter: Payer: Self-pay | Admitting: Sports Medicine

## 2017-11-26 ENCOUNTER — Telehealth: Payer: Self-pay | Admitting: Sports Medicine

## 2017-11-26 VITALS — BP 120/82 | HR 78 | Ht 68.0 in | Wt 217.0 lb

## 2017-11-26 DIAGNOSIS — M5410 Radiculopathy, site unspecified: Secondary | ICD-10-CM | POA: Diagnosis not present

## 2017-11-26 DIAGNOSIS — M24552 Contracture, left hip: Secondary | ICD-10-CM

## 2017-11-26 DIAGNOSIS — M9903 Segmental and somatic dysfunction of lumbar region: Secondary | ICD-10-CM | POA: Diagnosis not present

## 2017-11-26 DIAGNOSIS — M5442 Lumbago with sciatica, left side: Secondary | ICD-10-CM | POA: Diagnosis not present

## 2017-11-26 DIAGNOSIS — M4726 Other spondylosis with radiculopathy, lumbar region: Secondary | ICD-10-CM | POA: Diagnosis not present

## 2017-11-26 DIAGNOSIS — M9905 Segmental and somatic dysfunction of pelvic region: Secondary | ICD-10-CM | POA: Diagnosis not present

## 2017-11-26 DIAGNOSIS — M25552 Pain in left hip: Secondary | ICD-10-CM | POA: Diagnosis not present

## 2017-11-26 MED ORDER — DIAZEPAM 5 MG PO TABS: 5.0000 mg | ORAL_TABLET | Freq: Three times a day (TID) | ORAL | 0 refills | Status: DC | PRN

## 2017-11-26 NOTE — Telephone Encounter (Signed)
Copied from [place] [id number]. Topic: Referral - Request >> Nov 26, 2017  4:36 PM Bea Graff, NT wrote: Reason for CRM: Pt states that Dr. Ernestina Patches is unable to give her a epidural injection until 2 weeks out and she wants to see if there is another provider that she can see to have it done sooner?

## 2017-11-26 NOTE — Progress Notes (Signed)
[name]. Lissa Rowles, [place] at [facility] [phone]  [name] - 57 y.o. female MRN [id number]  Date of birth: 04-24-1961  Visit Date: 11/26/2017  PCP: Martinique, Betty G, MD   Referred by: Martinique, Betty G, MD  Scribe(s) for today's visit: Wendy Poet, LAT, ATC  SUBJECTIVE:  Destiny Frost is here for Follow-up (back and L LE pain) .    11/20/2017: Her L gluteal pain symptoms INITIALLY: Began mid April 2019 and she has hx of similar issue.  Described as moderate nagging aching, radiating to the L LE. Occasionally the pain will radiate to the L heel. She did notice sharp shooting pains in her leg the other night but it has resolved.  Worsened with sitting, stretching Improved with standing. Walking was helping but hasn't over the past 2 weeks.  Additional associated symptoms include: Had XR L-spine with Dr. Jimmye Norman. She reports weakness in both hips.    At this time symptoms are worsening compared to onset. She has gotten injection and done PT in the past. She has been seeing Dr. Jimmye Norman (chiropractor) with some relief, dry needling. She was put on Gabapentin at bedtime with Dr. Tamala Julian but doesn't feel that it has been beneficial. She has tried IBU with some relief.   11/26/2017: Compared to the last office visit on 11/20/17, her previously described back and L  LE symptoms are worsening.  She states that last night was bad and was not able to get comfortable for about 2.5 hours.  She has been to see her chiro this morning and that treatment did not help either.  Current symptoms are moderate & are radiating to L post thigh She has been taking Gabapentin 300 mg tid.  She had a toradol/depo 80 injection at her last visit which did not help.  She continues to see Dr. Jimmye Norman (chiro).   REVIEW OF SYSTEMS: Reports night time disturbances. Reports fevers, chills, or night sweats.  Night sweats Denies unexplained weight loss. Reports  personal history of cancer.  Skin CA Denies changes in bowel or bladder habits. Denies recent unreported falls. Denies new or worsening dyspnea or wheezing. Denies headaches or dizziness.  Reports numbness, tingling or weakness  In the extremities - weakness in the L LE Denies dizziness or presyncopal episodes Denies lower extremity edema    HISTORY & PERTINENT PRIOR DATA:  Prior History reviewed and updated per electronic medical record.  Significant/pertinent history, findings, studies include:  reports that she has never smoked. She has never used smokeless tobacco. No results for input(s): HGBA1C, LABURIC, CREATINE in the last 8760 hours. No specialty comments available. No problems updated.  OBJECTIVE:  VS:  HT:5\' 8"  (172.7 cm)   WT:217 lb (98.4 kg)  BMI:33    BP:120/82  HR:78bpm  TEMP: ( )  RESP:99 %   PHYSICAL EXAM: Constitutional: WDWN, Non-toxic appearing. Psychiatric: Alert & appropriately interactive.  Not depressed or anxious appearing. Respiratory: No increased work of breathing.  Trachea Midline Eyes: Pupils are equal.  EOM intact without nystagmus.  No scleral icterus  Vascular Exam: warm to touch no edema  lower extremity neuro exam: normal strength She is able to heel and toe walk without difficulty.  Marked neural tension signs with straight leg raise and backward stretch test on the left.  Positive slump sign.  MSK Exam: Good internal and external rotation of bilateral hips.  She has tenderness over the bilateral greater trochanters left greater than right  but this is minimal.  Moderate pain with palpation of the gluteal musculature and greater sciatic notch.   ASSESSMENT & PLAN:   1. Radiculitis   2. Osteoarthritis of spine with radiculopathy, lumbar region   3. Left hip flexor tightness     PLAN: Overall she is actually worsening and is having worsening neural tension signs.  She has a positive straight leg raise as well as record stretch test  today.  She is able to heel and toe walk which is reassuring but given the worsening I would like to actually send her over for a diagnostic and therapeutic epidural steroid injection with Dr. Ernestina Patches as soon as possible.  We will plan to have her continue titrating the gabapentin and have her begin on Valium at night to see if we can add some increased comfort for her.  She will take a Valium 1 hour prior to her appointment with Dr. Ernestina Patches as well and will have a driver for this.  Follow-up in 4 weeks as previously scheduled.  If epidural is nondiagnostic or persistent symptoms further diagnostic evaluation with MRI will be obtained.  Discussed red flag symptoms that warrant earlier emergent evaluation and patient voices understanding.  Follow-up: No follow-ups on file.      Please see additional documentation for Objective, Assessment and Plan sections. Pertinent additional documentation may be included in corresponding procedure notes, imaging studies, problem based documentation and patient instructions. Please see these sections of the encounter for additional information regarding this visit.  CMA/ATC served as Education administrator during this visit. History, Physical, and Plan performed by medical provider. Documentation and orders reviewed and attested to.      Gerda Diss, [place] [facility] Physician

## 2017-11-27 ENCOUNTER — Other Ambulatory Visit: Payer: Self-pay

## 2017-11-27 DIAGNOSIS — M4726 Other spondylosis with radiculopathy, lumbar region: Secondary | ICD-10-CM

## 2017-11-27 NOTE — Telephone Encounter (Signed)
Spoke with pt and advised that new order has been sent to [facility] for her epidural injection. Angelita Ingles should be reaching out to her about scheduling. If she does not hear from anyone at [facility], she will give them a call.

## 2017-11-29 ENCOUNTER — Ambulatory Visit
Admission: RE | Admit: 2017-11-29 | Discharge: 2017-11-29 | Disposition: A | Discharge status: Home / Self Care | Payer: BLUE CROSS/BLUE SHIELD | Source: Ambulatory Visit | Attending: Sports Medicine | Admitting: Sports Medicine

## 2017-11-29 ENCOUNTER — Encounter: Payer: Self-pay | Admitting: Radiology

## 2017-11-29 DIAGNOSIS — M4726 Other spondylosis with radiculopathy, lumbar region: Secondary | ICD-10-CM

## 2017-11-29 DIAGNOSIS — M4727 Other spondylosis with radiculopathy, lumbosacral region: Secondary | ICD-10-CM | POA: Diagnosis not present

## 2017-11-29 MED ORDER — IOPAMIDOL (ISOVUE-M 200) INJECTION 41%
1.0000 mL | Freq: Once | INTRAMUSCULAR | Status: AC
  Administered 2017-11-29: 1 mL via EPIDURAL

## 2017-11-29 MED ORDER — METHYLPREDNISOLONE ACETATE 40 MG/ML INJ SUSP (RADIOLOG
120.0000 mg | Freq: Once | INTRAMUSCULAR | Status: AC
  Administered 2017-11-29: 120 mg via EPIDURAL

## 2017-11-29 NOTE — Discharge Instructions (Signed)

## 2017-12-05 ENCOUNTER — Other Ambulatory Visit: Payer: BLUE CROSS/BLUE SHIELD

## 2017-12-10 NOTE — Progress Notes (Signed)
Destiny Frost. Destiny Frost, [place] at [facility] [phone]  Destiny Frost - 57 y.o. female MRN [id number]  Date of birth: 1961-03-18  Visit Date: 12/11/2017  PCP: Patient, No Pcp Per   Referred by: Martinique, Betty G, MD  Scribe(s) for today's visit: Destiny Frost, LAT, ATC  SUBJECTIVE:  Destiny Frost is here for Follow-up (L-spine radiculopathy and L hip flexor) .    11/20/2017: Her L gluteal pain symptoms INITIALLY: Began mid April 2019 and she has hx of similar issue.  Described as moderate nagging aching, radiating to the L LE. Occasionally the pain will radiate to the L heel. She did notice sharp shooting pains in her leg the other night but it has resolved.  Worsened with sitting, stretching Improved with standing. Walking was helping but hasn't over the past 2 weeks.  Additional associated symptoms include: Had XR L-spine with Dr. Jimmye Frost. She reports weakness in both hips.    At this time symptoms are worsening compared to onset. She has gotten injection and done PT in the past. She has been seeing Dr. Jimmye Frost (chiropractor) with some relief, dry needling. She was put on Gabapentin at bedtime with Dr. Tamala Frost but doesn't feel that it has been beneficial. She has tried IBU with some relief.   11/26/2017: Compared to the last office visit on 11/20/17, her previously described back and L  LE symptoms are worsening.  She states that last night was bad and was not able to get comfortable for about 2.5 hours.  She has been to see her chiro this morning and that treatment did not help either.  Current symptoms are moderate & are radiating to L post thigh She has been taking Gabapentin 300 mg tid.  She had a toradol/depo 80 injection at her last visit which did not help.  She continues to see Dr. Jimmye Frost (chiro).  12/11/2017: Compared to the last office visit on 11/26/17, her previously described L-spine pain w/ radiculopathy symptoms are improving  but her symptoms are not completely resolved after having her epidural on 11/29/17.  She con't to have pain along the post aspect of her L thigh.  She notes that she gets increased sacral pain w/ attempts at doing [place]. Current symptoms are moderate nagging and aching & are radiating to L post thigh. She has been taking Tylenol.  She ran out of the Gabapentin 300mg  last week and feels like she's doing fine w/o it and in some ways feels better w/o it. She had an epidural at [facility] on 11/29/17.       REVIEW OF SYSTEMS: Reports night time disturbances. Reports fevers, chills, or night sweats.  Yes to night sweats. Denies unexplained weight loss. Reports personal history of cancer.  Skin cancer Denies changes in bowel or bladder habits. Denies recent unreported falls. Denies new or worsening dyspnea or wheezing. Denies headaches or dizziness.  Reports numbness, tingling or weakness  In the extremities - weakness in the L LE Denies dizziness or presyncopal episodes Denies lower extremity edema    HISTORY & PERTINENT PRIOR DATA:  Prior History reviewed and updated per electronic medical record.  Significant/pertinent history, findings, studies include:  reports that she has never smoked. She has never used smokeless tobacco. No results for input(s): HGBA1C, LABURIC, CREATINE in the last 8760 hours. No specialty comments available. Problem  Radiculitis   MRI from 2014: 1. Right paracentral and lateral recess protrusion with caudal extension at L5-S1 results  in encroachment on the descending right S1 root. 2. Shallow central protrusion L4-5 causes mild narrowing of the lateral recesses and central canal without nerve root compression      OBJECTIVE:  VS:  HT:5\' 8"  (172.7 cm)   WT:212 lb (96.2 kg)  BMI:32.24    BP:130/82  HR:78bpm  TEMP: ( )  RESP:99 %   PHYSICAL EXAM: CONSTITUTIONAL: Well-developed, Well-nourished and In no acute distress Alert & appropriately  interactive. and Not depressed or anxious appearing. Respiratory: No increased work of breathing.  Trachea Midline EYES: Pupils are equal., EOM intact without nystagmus. and No scleral icterus.  Lower EXTREMITY EXAM: Warm and well perfused NEURO: unremarkable  MSK Exam: Back exam: Negative straight leg raise on the right but still positive to the left with improved range of motion but still bilaterally tight hamstrings. Lower extremity sensation intact light touch.  DTRs are symmetric.   PROCEDURES & DATA REVIEWED:  none  ASSESSMENT   1. Osteoarthritis of spine with radiculopathy, lumbar region   2. Radiculitis     PLAN:    Radiculitis Given 30 to 40% improvement with the diagnostic and empiric injection I would like to go ahead and set her up for an MRI at this time so that we may be able to better target the suspected lesion.  I will plan to call her with the results of the MRI and can coordinate repeat injections as indicated based on findings.  Orders Placed This Encounter  Procedures  . MR Lumbar Spine Wo Contrast   Meds ordered this encounter  Medications  . traMADol (ULTRAM) 50 MG tablet    Sig: Take 1 tablet (50 mg total) by mouth every 6 (six) hours as needed for up to 5 days for moderate pain.    Dispense:  20 tablet    Refill:  0     Follow-up: Return for We will call you about your results.      Please see additional documentation for Objective, Assessment and Plan sections. Pertinent additional documentation may be included in corresponding procedure notes, imaging studies, problem based documentation and patient instructions. Please see these sections of the encounter for additional information regarding this visit.  CMA/ATC served as Education administrator during this visit. History, Physical, and Plan performed by medical provider. Documentation and orders reviewed and attested to.      Gerda Diss, [place] Sports Medicine Physician

## 2017-12-11 ENCOUNTER — Encounter: Payer: Self-pay | Admitting: Sports Medicine

## 2017-12-11 ENCOUNTER — Ambulatory Visit (INDEPENDENT_AMBULATORY_CARE_PROVIDER_SITE_OTHER): Payer: BLUE CROSS/BLUE SHIELD | Admitting: Sports Medicine

## 2017-12-11 VITALS — BP 130/82 | HR 78 | Ht 68.0 in | Wt 212.0 lb

## 2017-12-11 DIAGNOSIS — M4726 Other spondylosis with radiculopathy, lumbar region: Secondary | ICD-10-CM | POA: Diagnosis not present

## 2017-12-11 DIAGNOSIS — M5410 Radiculopathy, site unspecified: Secondary | ICD-10-CM

## 2017-12-11 MED ORDER — TRAMADOL HCL 50 MG PO TABS: 50.0000 mg | ORAL_TABLET | Freq: Four times a day (QID) | ORAL | 0 refills | Status: DC | PRN

## 2017-12-11 NOTE — Assessment & Plan Note (Signed)
Given 30 to 40% improvement with the diagnostic and empiric injection I would like to go ahead and set her up for an MRI at this time so that we may be able to better target the suspected lesion.  I will plan to call her with the results of the MRI and can coordinate repeat injections as indicated based on findings.

## 2017-12-11 NOTE — Patient Instructions (Signed)

## 2017-12-14 ENCOUNTER — Encounter

## 2017-12-14 ENCOUNTER — Encounter (INDEPENDENT_AMBULATORY_CARE_PROVIDER_SITE_OTHER): Payer: Self-pay | Admitting: Physical Medicine and Rehabilitation

## 2017-12-14 ENCOUNTER — Ambulatory Visit
Admission: RE | Admit: 2017-12-14 | Discharge: 2017-12-14 | Disposition: A | Discharge status: Home / Self Care | Payer: BLUE CROSS/BLUE SHIELD | Source: Ambulatory Visit | Attending: Sports Medicine | Admitting: Sports Medicine

## 2017-12-14 DIAGNOSIS — M48061 Spinal stenosis, lumbar region without neurogenic claudication: Secondary | ICD-10-CM | POA: Diagnosis not present

## 2017-12-14 DIAGNOSIS — M4726 Other spondylosis with radiculopathy, lumbar region: Secondary | ICD-10-CM

## 2017-12-15 ENCOUNTER — Encounter: Payer: Self-pay | Admitting: Sports Medicine

## 2017-12-17 ENCOUNTER — Encounter: Payer: Self-pay | Admitting: Sports Medicine

## 2017-12-17 ENCOUNTER — Other Ambulatory Visit: Payer: Self-pay | Admitting: Physical Therapy

## 2017-12-17 ENCOUNTER — Telehealth: Payer: Self-pay | Admitting: General Practice

## 2017-12-17 MED ORDER — TRAMADOL HCL 50 MG PO TABS: 50.0000 mg | ORAL_TABLET | Freq: Four times a day (QID) | ORAL | 0 refills | Status: DC | PRN

## 2017-12-17 NOTE — Telephone Encounter (Signed)
See note

## 2017-12-17 NOTE — Telephone Encounter (Signed)
Copied from [place] [id number]. Topic: Quick Communication - Rx Refill/Question >> Dec 17, 2017 10:19 AM Sheran Luz wrote: Medication: traMADol (ULTRAM) 50 MG tablet   Pt is requesting a refill of this medication. Pt states that she is almost out. Informed pt that she needs an appointment.   Preferred Pharmacy (with phone number or street name): [place], [place] - [address] [phone] (Phone) [phone] (Fax)    Agent: Please be advised that RX refills may take up to 3 business days. We ask that you follow-up with your pharmacy.

## 2017-12-17 NOTE — Telephone Encounter (Signed)
Refill request sent to Dr. Paulla Fore

## 2017-12-18 ENCOUNTER — Ambulatory Visit (INDEPENDENT_AMBULATORY_CARE_PROVIDER_SITE_OTHER): Payer: BLUE CROSS/BLUE SHIELD | Admitting: Sports Medicine

## 2017-12-18 ENCOUNTER — Encounter: Payer: Self-pay | Admitting: Sports Medicine

## 2017-12-18 VITALS — BP 154/86 | HR 88 | Ht 68.0 in | Wt 215.2 lb

## 2017-12-18 DIAGNOSIS — M4726 Other spondylosis with radiculopathy, lumbar region: Secondary | ICD-10-CM

## 2017-12-18 DIAGNOSIS — M5410 Radiculopathy, site unspecified: Secondary | ICD-10-CM | POA: Diagnosis not present

## 2017-12-18 NOTE — Progress Notes (Signed)
Multiple findings consistent with prior degenerative changes appreciated on prior MRI.  No significant new abnormality but likely S1 nerve root impingement.  Patient has follow-up appointment scheduled for today.

## 2017-12-18 NOTE — Progress Notes (Signed)
Destiny Frost. Destiny Frost, [name] MRN [id number]  Date of birth: Jul 17, 1960  Visit Date: 12/18/2017  PCP: Patient, No Pcp Per   Referred by: No ref. provider found  Scribe(s) for today's visit: Josepha Pigg, CMA  SUBJECTIVE:  Destiny Frost is here for Follow-up (L spine) .    11/20/2017: Her L gluteal pain symptoms INITIALLY: Began mid April 2019 and she has hx of similar issue.  Described as moderate nagging aching, radiating to the L LE. Occasionally the pain will radiate to the L heel. She did notice sharp shooting pains in her leg the other night but it has resolved.  Worsened with sitting, stretching Improved with standing. Walking was helping but hasn't over the past 2 weeks.  Additional associated symptoms include: Had XR L-spine with Dr. Jimmye Norman. She reports weakness in both hips.   At this time symptoms are worsening compared to onset. She has gotten injection and done PT in the past. She has been seeing Dr. Jimmye Norman (chiropractor) with some relief, dry needling. She was put on Gabapentin at bedtime with Dr. Tamala Julian but doesn't feel that it has been beneficial. She has tried IBU with some relief.   11/26/2017: Compared to the last office visit on 11/20/17, her previously described back and L  LE symptoms are worsening.  She states that last night was bad and was not able to get comfortable for about 2.5 hours.  She has been to see her chiro this morning and that treatment did not help either.  Current symptoms are moderate & are radiating to L post thigh She has been taking Gabapentin 300 mg tid.  She had a toradol/depo 80 injection at her last visit which did not help.  She continues to see Dr. Jimmye Norman (chiro).  12/11/2017: Compared to the last office visit on 11/26/17, her previously described L-spine pain w/ radiculopathy symptoms are improving but her symptoms are not  completely resolved after having her epidural on 11/29/17.  She con't to have pain along the post aspect of her L thigh.  She notes that she gets increased sacral pain w/ attempts at doing [place]. Current symptoms are moderate nagging and aching & are radiating to L post thigh. She has been taking Tylenol.  She ran out of the Gabapentin 300mg  last week and feels like she's doing fine w/o it and in some ways feels better w/o it. She had an epidural at [facility] on 11/29/17.    12/18/2017: Compared to the last office visit, her previously described symptoms show no change.  Current symptoms are moderate & are radiating to the L posterior thigh.  She has been taking Tramadol with some relief.   Review results from MRI L-spine done 12/14/2017   REVIEW OF SYSTEMS: Reports night time disturbances. Reports night sweats. Denies unexplained weight loss. Reports personal history of skin cancer. Denies changes in bowel or bladder habits. Denies recent unreported falls. Denies new or worsening dyspnea or wheezing. Denies headaches or dizziness.  Reports numbness, tingling or weakness  In the extremities.  Denies dizziness or presyncopal episodes Denies lower extremity edema     HISTORY & PERTINENT PRIOR DATA:  Significant/pertinent history, findings, studies include:  reports that she has never smoked. She has never used smokeless tobacco. No results for input(s): HGBA1C, LABURIC, CREATINE in the last 8760 hours. No specialty comments available. No problems updated.  Otherwise prior history  reviewed and updated per electronic medical record.    OBJECTIVE:  VS:  HT:5\' 8"  (172.7 cm)   WT:215 lb 3.2 oz (97.6 kg)  BMI:32.73    BP:(Abnormal) 154/86  HR:88bpm  TEMP: ( )  RESP:97 %   PHYSICAL EXAM: CONSTITUTIONAL: Well-developed, Well-nourished and In no acute distress Alert & appropriately interactive. and Not depressed or anxious appearing. RESPIRATORY: No increased work of  breathing and Trachea Midline EYES: Pupils are equal., EOM intact without nystagmus. and No scleral icterus.  Lower extremities: Warm and well perfused  .BACK Exam: Normal alignment & Contours Skin: No overlying erythema/ecchymosis  MOTOR TESTING: Able to heel and toe walk without difficutly       RIGHT    LEFT Straight leg raise-------------------------: positive, moderate pain                         positive, moderate pain       REFLEXES Right Left  DTR - L3/4 -Patellar    DTR - L5/S1 - Achilles       PROCEDURES & DATA REVIEWED:  Outside/prior images reviewed today with the patient that showed Multilevel degenerative changes of her lumbar spine most focally at L4-L5 and L5-S1.  ASSESSMENT   1. Radiculitis   2. Osteoarthritis of spine with radiculopathy, lumbar region     PLAN:       . Referral to Dr. Ernestina Patches placed today for consideration of transforaminal injections per either bilateral L5-S1 or L4-5.  I will defer to Dr. Romona Curls expertise on this. . She has already undergone a interlaminar injection per imaging with only 30% improvement. . If any lack of improvement consider further diagnostic evaluation with Nerve conduction studies/EMG or referral to neurosurgery. . >50% of this 25 minutes minute visit spent in direct patient counseling and/or coordination of care. Discussion was focused on education regarding the in discussing the pathoetiology and anticipated clinical course of the above condition. No problem-specific Assessment & Plan notes found for this encounter.  Follow-up: Return in about 8 weeks (around 02/12/2018) for repeat clinical exam.      Please see additional documentation for Objective, Assessment and Plan sections. Pertinent additional documentation may be included in corresponding procedure notes, imaging studies, problem based documentation and patient instructions. Please see these sections of the encounter for additional information  regarding this visit.  CMA/ATC served as Education administrator during this visit. History, Physical, and Plan performed by medical provider. Documentation and orders reviewed and attested to.      Gerda Diss, [place] Sports Medicine Physician

## 2017-12-18 NOTE — Patient Instructions (Addendum)
Remember to try to cut back on your tramadol to 2 to 3/day.  Dr. Ernestina Patches office should be calling you.

## 2017-12-19 ENCOUNTER — Encounter: Payer: Self-pay | Admitting: Sports Medicine

## 2017-12-19 ENCOUNTER — Other Ambulatory Visit: Payer: BLUE CROSS/BLUE SHIELD

## 2017-12-19 ENCOUNTER — Ambulatory Visit: Payer: BLUE CROSS/BLUE SHIELD | Admitting: Sports Medicine

## 2017-12-21 ENCOUNTER — Telehealth (INDEPENDENT_AMBULATORY_CARE_PROVIDER_SITE_OTHER): Payer: Self-pay

## 2017-12-21 NOTE — Telephone Encounter (Signed)
Patient left voicemail on Dr Romona Curls area. She states she has an injection scheduled for 01/01/18 didn't know if she's in a waiting list to see if she can be seen sooner. States she doesn't know if she needed to call everyday or if she just waits for a phone call (she doesn't want to be annoying by calling everyday she said) to get her in sooner for inj? Would like a call back.  CB [phone]

## 2017-12-24 NOTE — Telephone Encounter (Signed)
Added to wait list. Notified patient that we will call her if we have any cancelations.

## 2017-12-26 ENCOUNTER — Other Ambulatory Visit: Payer: Self-pay | Admitting: Sports Medicine

## 2017-12-27 NOTE — Telephone Encounter (Signed)
Last OV 12/18/17, last refill 01/17/18 #30/0 refills.

## 2018-01-01 ENCOUNTER — Ambulatory Visit (INDEPENDENT_AMBULATORY_CARE_PROVIDER_SITE_OTHER): Payer: BLUE CROSS/BLUE SHIELD | Admitting: Physical Medicine and Rehabilitation

## 2018-01-01 ENCOUNTER — Encounter

## 2018-01-01 ENCOUNTER — Encounter (INDEPENDENT_AMBULATORY_CARE_PROVIDER_SITE_OTHER): Payer: Self-pay | Admitting: Physical Medicine and Rehabilitation

## 2018-01-01 ENCOUNTER — Ambulatory Visit (INDEPENDENT_AMBULATORY_CARE_PROVIDER_SITE_OTHER): Payer: Self-pay

## 2018-01-01 VITALS — BP 153/91 | HR 66 | Temp 98.1°F

## 2018-01-01 DIAGNOSIS — M5116 Intervertebral disc disorders with radiculopathy, lumbar region: Secondary | ICD-10-CM | POA: Diagnosis not present

## 2018-01-01 MED ORDER — BETAMETHASONE SOD PHOS & ACET 6 (3-3) MG/ML IJ SUSP
12.0000 mg | Freq: Once | INTRAMUSCULAR | Status: AC
  Administered 2018-01-01: 12 mg

## 2018-01-01 NOTE — Progress Notes (Signed)
 .  Numeric Pain Rating Scale and Functional Assessment Average Pain 5   In the last MONTH (on 0-10 scale) has pain interfered with the following?  1. General activity like being  able to carry out your everyday physical activities such as walking, climbing stairs, carrying groceries, or moving a chair?  Rating(3)   +Driver, -BT, -Dye Allergies.  

## 2018-01-01 NOTE — Patient Instructions (Signed)

## 2018-01-07 DIAGNOSIS — M47816 Spondylosis without myelopathy or radiculopathy, lumbar region: Secondary | ICD-10-CM | POA: Diagnosis not present

## 2018-01-11 ENCOUNTER — Telehealth (INDEPENDENT_AMBULATORY_CARE_PROVIDER_SITE_OTHER): Payer: Self-pay | Admitting: Physical Medicine and Rehabilitation

## 2018-01-11 NOTE — Telephone Encounter (Signed)
Ok, could have been rash from bandaid or chloroprep. Doubt it was contrast if localized

## 2018-01-11 NOTE — Telephone Encounter (Signed)
Scheduled for 9/24 at 1530.

## 2018-01-14 DIAGNOSIS — M5416 Radiculopathy, lumbar region: Secondary | ICD-10-CM | POA: Diagnosis not present

## 2018-01-17 NOTE — Progress Notes (Signed)
Destiny Frost - 57 y.o. female MRN [id number]  Date of birth: 06/29/1960  Office Visit Note: Visit Date: 01/01/2018 PCP: Patient, No Pcp Per Referred by: Gerda Diss, DO  Subjective: Chief Complaint  Patient presents with  . Lower Back - Pain   HPI: [name] is a 57 year old female who is been followed extensively by Dr. Teresa Coombs and comes in today his request for interventional spine procedure.  She has had one epidural injection which was an L5-S1 interlaminar injection by [facility] imaging on 11/29/2017 that gave her maybe 30% relief.  She is been having this ongoing pain at the center of the low back that radiates into the left leg.  This started at the end of March of this year.  Sitting makes her pain much worse walking tends to help and this is consistent with disc related pathology clinically.  Her average pain is 5 out of 10.  It does affect her daily living.  MRI was performed after the last epidural injection by [facility] imaging and this shows significant disc protrusion herniation at L5-S1 a little bit more right-sided than left but clearly could affect either side.  I think this is her pain generator.  She has some spondylosis at L4-5 but nothing significant.  Small central protrusion here.  I think the next step is a left S1 transforaminal injection because of most of her pain being on the left.  Would consider repeat at some point S1 injections if they were helpful and ongoing with her continuing conservative care.   ROS Otherwise per HPI.  Assessment & Plan: Visit Diagnoses:  1. Radiculopathy due to lumbar intervertebral disc disorder     Plan: No additional findings.   Meds & Orders:  Meds ordered this encounter  Medications  . betamethasone acetate-betamethasone sodium phosphate (CELESTONE) injection 12 mg    Orders Placed This Encounter  Procedures  . XR C-ARM NO REPORT  . Epidural Steroid injection    Follow-up: Return if symptoms worsen or fail to  improve.   Procedures: No procedures performed  S1 Lumbosacral Transforaminal Epidural Steroid Injection - Sub-Pedicular Approach with Fluoroscopic Guidance   Patient: Destiny Frost      Date of Birth: Mar 27, 1961 MRN: [id number] PCP: Patient, No Pcp Per      Visit Date: 01/01/2018   Universal Protocol:    Date/Time: 09/19/196:16 AM  Consent Given By: the patient  Position:  PRONE  Additional Comments: Vital signs were monitored before and after the procedure. Patient was prepped and draped in the usual sterile fashion. The correct patient, procedure, and site was verified.   Injection Procedure Details:  Procedure Site One Meds Administered:  Meds ordered this encounter  Medications  . betamethasone acetate-betamethasone sodium phosphate (CELESTONE) injection 12 mg    Laterality: Left  Location/Site:  S1 Foramen   Needle size: 22 ga.  Needle type: Spinal  Needle Placement: Transforaminal  Findings:   -Comments: Excellent flow of contrast along the nerve and into the epidural space.  Procedure Details: After squaring off the sacral end-plate to get a true AP view, the C-arm was positioned so that the best possible view of the S1 foramen was visualized. The soft tissues overlying this structure were infiltrated with 2-3 ml. of 1% Lidocaine without Epinephrine.    The spinal needle was inserted toward the target using a "trajectory" view along the fluoroscope beam.  Under AP and lateral visualization, the needle was advanced so it did not puncture dura.  Biplanar projections were used to confirm position. Aspiration was confirmed to be negative for CSF and/or blood. A 1-2 ml. volume of Isovue-250 was injected and flow of contrast was noted at each level. Radiographs were obtained for documentation purposes.   After attaining the desired flow of contrast documented above, a 0.5 to 1.0 ml test dose of 0.25% Marcaine was injected into each respective transforaminal  space.  The patient was observed for 90 seconds post injection.  After no sensory deficits were reported, and normal lower extremity motor function was noted,   the above injectate was administered so that equal amounts of the injectate were placed at each foramen (level) into the transforaminal epidural space.   Additional Comments:  The patient tolerated the procedure well Dressing: Band-Aid    Post-procedure details: Patient was observed during the procedure. Post-procedure instructions were reviewed.  Patient left the clinic in stable condition.    Clinical History: MRI LUMBAR SPINE WITHOUT CONTRAST  TECHNIQUE: Multiplanar, multisequence MR imaging of the lumbar spine was performed. No intravenous contrast was administered.  COMPARISON:  Previous MRI from 11/07/2012.  FINDINGS: Segmentation: Normal segmentation. Lowest well-formed disc labeled the L5-S1 level.  Alignment: Mild right convex scoliosis. Alignment otherwise normal with preservation of the normal lumbar lordosis.  Vertebrae: Vertebral body heights maintained without evidence for acute or chronic fracture. Bone marrow signal intensity within normal limits. No discrete or worrisome osseous lesions. No abnormal marrow edema.  Conus medullaris and cauda equina: Conus extends to the L1-2 level. Conus and cauda equina appear normal.  Paraspinal and other soft tissues: Paraspinous soft tissues within normal limits. Visualized visceral structures unremarkable.  Disc levels:  L1-2:  Unremarkable.  L2-3:  Unremarkable.  L3-4:  Unremarkable.  L4-5: Mild diffuse disc bulge with disc desiccation. Superimposed small central disc protrusion mildly indents the ventral thecal sac, slightly regressed from previous. Mild facet and ligament flavum hypertrophy. Resultant mild canal with bilateral lateral recess narrowing, slightly greater on the left. Foramina remain patent.  L5-S1: Chronic  intervertebral disc space narrowing with diffuse disc bulge and disc desiccation. Mild reactive endplate changes. Superimposed broad central disc protrusion with associated right subarticular component and inferior migration. Protruding disc contacts the descending S1 nerve roots bilaterally, with a small right subarticular component impinging upon the descending right S1 nerve root (series 6, image 36). Resultant moderate bilateral lateral recess stenosis, right worse than left. Overall, appearance is relatively unchanged from previous. Foramina remain patent.  IMPRESSION: 1. Central/right subarticular disc protrusion at L5-S1 with resultant moderate bilateral right lateral recess stenosis, right worse than left. Either of the descending S1 nerve roots could be affected. Appearance is similar to previous. 2. Shallow central disc protrusion at L4-5 with resultant mild canal and bilateral lateral recess stenosis. This disc protrusion has mildly regressed from previous.   Electronically Signed   By: Jeannine Boga M.D.   On: 12/15/2017 00:35   She reports that she has never smoked. She has never used smokeless tobacco. No results for input(s): HGBA1C, LABURIC in the last 8760 hours.  Objective:  VS:  HT:    WT:   BMI:     BP:(!) 153/91  HR:66bpm  TEMP:98.1 F (36.7 C)(Oral)  RESP:  Physical Exam  Ortho Exam Imaging: No results found.  Past Medical/Family/Surgical/Social History: Medications & Allergies reviewed per EMR, new medications updated. Patient Active Problem List   Diagnosis Date Noted  . Osteoarthritis of spine with radiculopathy, lumbar region 11/20/2017  . Radiculitis 10/23/2017  . Fibroid  Past Medical History:  Diagnosis Date  . Fibroid    Family History  Problem Relation Age of Onset  . Cancer Father   . Hypertension Mother   . Cancer Mother        SKIN   Past Surgical History:  Procedure Laterality Date  . TUBAL LIGATION    .  WISDOM TOOTH EXTRACTION     Social History   Occupational History  . Not on file  Tobacco Use  . Smoking status: Never Smoker  . Smokeless tobacco: Never Used  Substance and Sexual Activity  . Alcohol use: No  . Drug use: No  . Sexual activity: Yes    Partners: Male    Birth control/protection: Surgical

## 2018-01-17 NOTE — Procedures (Signed)
S1 Lumbosacral Transforaminal Epidural Steroid Injection - Sub-Pedicular Approach with Fluoroscopic Guidance   Patient: Destiny Frost      Date of Birth: Mar 16, 1961 MRN: [id number] PCP: Patient, No Pcp Per      Visit Date: 01/01/2018   Universal Protocol:    Date/Time: 09/19/196:16 AM  Consent Given By: the patient  Position:  PRONE  Additional Comments: Vital signs were monitored before and after the procedure. Patient was prepped and draped in the usual sterile fashion. The correct patient, procedure, and site was verified.   Injection Procedure Details:  Procedure Site One Meds Administered:  Meds ordered this encounter  Medications  . betamethasone acetate-betamethasone sodium phosphate (CELESTONE) injection 12 mg    Laterality: Left  Location/Site:  S1 Foramen   Needle size: 22 ga.  Needle type: Spinal  Needle Placement: Transforaminal  Findings:   -Comments: Excellent flow of contrast along the nerve and into the epidural space.  Procedure Details: After squaring off the sacral end-plate to get a true AP view, the C-arm was positioned so that the best possible view of the S1 foramen was visualized. The soft tissues overlying this structure were infiltrated with 2-3 ml. of 1% Lidocaine without Epinephrine.    The spinal needle was inserted toward the target using a "trajectory" view along the fluoroscope beam.  Under AP and lateral visualization, the needle was advanced so it did not puncture dura. Biplanar projections were used to confirm position. Aspiration was confirmed to be negative for CSF and/or blood. A 1-2 ml. volume of Isovue-250 was injected and flow of contrast was noted at each level. Radiographs were obtained for documentation purposes.   After attaining the desired flow of contrast documented above, a 0.5 to 1.0 ml test dose of 0.25% Marcaine was injected into each respective transforaminal space.  The patient was observed for 90 seconds post  injection.  After no sensory deficits were reported, and normal lower extremity motor function was noted,   the above injectate was administered so that equal amounts of the injectate were placed at each foramen (level) into the transforaminal epidural space.   Additional Comments:  The patient tolerated the procedure well Dressing: Band-Aid    Post-procedure details: Patient was observed during the procedure. Post-procedure instructions were reviewed.  Patient left the clinic in stable condition.

## 2018-01-22 ENCOUNTER — Ambulatory Visit (INDEPENDENT_AMBULATORY_CARE_PROVIDER_SITE_OTHER): Payer: Self-pay

## 2018-01-22 ENCOUNTER — Encounter: Payer: Self-pay | Admitting: Family Medicine

## 2018-01-22 ENCOUNTER — Ambulatory Visit (INDEPENDENT_AMBULATORY_CARE_PROVIDER_SITE_OTHER): Payer: BLUE CROSS/BLUE SHIELD | Admitting: Physical Medicine and Rehabilitation

## 2018-01-22 ENCOUNTER — Encounter (INDEPENDENT_AMBULATORY_CARE_PROVIDER_SITE_OTHER): Payer: Self-pay | Admitting: Physical Medicine and Rehabilitation

## 2018-01-22 ENCOUNTER — Ambulatory Visit (INDEPENDENT_AMBULATORY_CARE_PROVIDER_SITE_OTHER): Payer: BLUE CROSS/BLUE SHIELD | Admitting: Family Medicine

## 2018-01-22 VITALS — BP 140/82 | HR 86 | Temp 98.6°F | Ht 68.0 in | Wt 209.6 lb

## 2018-01-22 VITALS — BP 132/92 | HR 64 | Temp 98.6°F

## 2018-01-22 DIAGNOSIS — Z114 Encounter for screening for human immunodeficiency virus [HIV]: Secondary | ICD-10-CM

## 2018-01-22 DIAGNOSIS — Z0000 Encounter for general adult medical examination without abnormal findings: Secondary | ICD-10-CM

## 2018-01-22 DIAGNOSIS — R002 Palpitations: Secondary | ICD-10-CM | POA: Diagnosis not present

## 2018-01-22 DIAGNOSIS — Z13220 Encounter for screening for lipoid disorders: Secondary | ICD-10-CM | POA: Diagnosis not present

## 2018-01-22 DIAGNOSIS — Z9189 Other specified personal risk factors, not elsewhere classified: Secondary | ICD-10-CM | POA: Diagnosis not present

## 2018-01-22 DIAGNOSIS — M5416 Radiculopathy, lumbar region: Secondary | ICD-10-CM

## 2018-01-22 DIAGNOSIS — Z1159 Encounter for screening for other viral diseases: Secondary | ICD-10-CM | POA: Diagnosis not present

## 2018-01-22 DIAGNOSIS — M4726 Other spondylosis with radiculopathy, lumbar region: Secondary | ICD-10-CM

## 2018-01-22 DIAGNOSIS — R1013 Epigastric pain: Secondary | ICD-10-CM

## 2018-01-22 LAB — CBC WITH DIFFERENTIAL/PLATELET
Basophils Absolute: 0 10*3/uL (ref 0.0–0.1)
Basophils Relative: 0.4 % (ref 0.0–3.0)
Eosinophils Absolute: 0.1 10*3/uL (ref 0.0–0.7)
Eosinophils Relative: 1.8 % (ref 0.0–5.0)
HCT: 39.1 % (ref 36.0–46.0)
Hemoglobin: 13.4 g/dL (ref 12.0–15.0)
Lymphocytes Relative: 24.6 % (ref 12.0–46.0)
Lymphs Abs: 1.5 10*3/uL (ref 0.7–4.0)
MCHC: 34.3 g/dL (ref 30.0–36.0)
MCV: 91 fl (ref 78.0–100.0)
Monocytes Absolute: 0.4 10*3/uL (ref 0.1–1.0)
Monocytes Relative: 7 % (ref 3.0–12.0)
Neutro Abs: 4.1 10*3/uL (ref 1.4–7.7)
Neutrophils Relative %: 66.2 % (ref 43.0–77.0)
Platelets: 212 10*3/uL (ref 150.0–400.0)
RBC: 4.3 Mil/uL (ref 3.87–5.11)
RDW: 13.2 % (ref 11.5–15.5)
WBC: 6.2 10*3/uL (ref 4.0–10.5)

## 2018-01-22 LAB — COMPREHENSIVE METABOLIC PANEL
ALT: 26 U/L (ref 0–35)
AST: 25 U/L (ref 0–37)
Albumin: 4.6 g/dL (ref 3.5–5.2)
Alkaline Phosphatase: 46 U/L (ref 39–117)
BUN: 21 mg/dL (ref 6–23)
CO2: 30 mEq/L (ref 19–32)
Calcium: 9.8 mg/dL (ref 8.4–10.5)
Chloride: 103 mEq/L (ref 96–112)
Creatinine, Ser: 0.61 mg/dL (ref 0.40–1.20)
GFR: 108.13 mL/min (ref >60.00)
Glucose, Bld: 90 mg/dL (ref 70–99)
Potassium: 4 mEq/L (ref 3.5–5.1)
Sodium: 141 mEq/L (ref 135–145)
Total Bilirubin: 0.7 mg/dL (ref 0.2–1.2)
Total Protein: 7.3 g/dL (ref 6.0–8.3)

## 2018-01-22 LAB — TSH: TSH: 1.7 u[IU]/mL (ref 0.35–4.50)

## 2018-01-22 LAB — LIPID PANEL
Cholesterol: 214 mg/dL — ABNORMAL HIGH (ref 0–200)
HDL: 69.3 mg/dL (ref >39.00)
LDL Cholesterol: 123 mg/dL — ABNORMAL HIGH (ref 0–99)
NonHDL: 145.11
Total CHOL/HDL Ratio: 3
Triglycerides: 112 mg/dL (ref 0.0–149.0)
VLDL: 22.4 mg/dL (ref 0.0–40.0)

## 2018-01-22 LAB — T4, FREE: Free T4: 0.94 ng/dL (ref 0.60–1.60)

## 2018-01-22 MED ORDER — BETAMETHASONE SOD PHOS & ACET 6 (3-3) MG/ML IJ SUSP
12.0000 mg | Freq: Once | INTRAMUSCULAR | Status: AC
  Administered 2018-01-22: 12 mg

## 2018-01-22 MED ORDER — MELOXICAM 15 MG PO TABS: 15.0000 mg | ORAL_TABLET | Freq: Every day | ORAL | 0 refills | Status: DC

## 2018-01-22 MED ORDER — BACLOFEN 20 MG PO TABS: 20.0000 mg | ORAL_TABLET | Freq: Two times a day (BID) | ORAL | 0 refills | Status: DC | PRN

## 2018-01-22 MED ORDER — KETOROLAC TROMETHAMINE 10 MG PO TABS: 10.0000 mg | ORAL_TABLET | Freq: Four times a day (QID) | ORAL | 0 refills | Status: DC | PRN

## 2018-01-22 MED ORDER — FAMOTIDINE 20 MG PO TABS: 20.0000 mg | ORAL_TABLET | Freq: Two times a day (BID) | ORAL | 0 refills | Status: AC

## 2018-01-22 NOTE — Progress Notes (Signed)
Destiny Frost - 57 y.o. female MRN [id number]  Date of birth: 02/23/1961  Office Visit Note: Visit Date: 01/22/2018 PCP: Briscoe Deutscher, DO Referred by: No ref. provider found  Subjective: Chief Complaint  Patient presents with  . Lower Back - Pain  . Left Foot - Pain  . Left Leg - Pain   HPI: Destiny Frost is a 57 year old female with prior history of lumbar disc herniation and good relief with interlaminar injection several years ago.  Repeat injection from an interlaminar approach at different facility was not very effective.  She is been followed by Dr. Teresa Coombs who kindly send her here for evaluation management.  We have completed left S1 transforaminal epidural steroid injection on 01/01/2018.  She reports at least 50% relief of pain.  She is not using tramadol only ibuprofen at this point.  She is using ice.  Sitting is much worse than standing.  Pain is still center of the back radiating to the left buttock and left leg and S1 distribution.  She did have a rash which look like on the picture from her self-harm to be more of a contact dermatitis.  It was not itchy and only last a few days it actually only started the fifth day after injection.  It does seem to be like it was probably in the area of the ChloraPrep.  We will just go ahead and use alcohol or antiseptic today.  We are going to repeat the injection.   ROS Otherwise per HPI.  Assessment & Plan: Visit Diagnoses:  1. Lumbar radiculopathy     Plan: No additional findings.   Meds & Orders:  Meds ordered this encounter  Medications  . betamethasone acetate-betamethasone sodium phosphate (CELESTONE) injection 12 mg    Orders Placed This Encounter  Procedures  . XR C-ARM NO REPORT  . Epidural Steroid injection    Follow-up: Return if symptoms worsen or fail to improve.   Procedures: No procedures performed  S1 Lumbosacral Transforaminal Epidural Steroid Injection - Sub-Pedicular Approach with Fluoroscopic Guidance    Patient: Destiny Frost      Date of Birth: [birth year] MRN: [id number] PCP: Briscoe Deutscher, DO      Visit Date: 01/22/2018   Universal Protocol:    Date/Time: 09/24/194:34 PM  Consent Given By: the patient  Position:  PRONE  Additional Comments: Vital signs were monitored before and after the procedure. Patient was prepped and draped in the usual sterile fashion. The correct patient, procedure, and site was verified.   Injection Procedure Details:  Procedure Site One Meds Administered:  Meds ordered this encounter  Medications  . betamethasone acetate-betamethasone sodium phosphate (CELESTONE) injection 12 mg    Laterality: Left  Location/Site:  S1 Foramen   Needle size: 22 ga.  Needle type: Spinal  Needle Placement: Transforaminal  Findings:   -Comments: Excellent flow of contrast along the nerve and into the epidural space.  Procedure Details: After squaring off the sacral end-plate to get a true AP view, the C-arm was positioned so that the best possible view of the S1 foramen was visualized. The soft tissues overlying this structure were infiltrated with 2-3 ml. of 1% Lidocaine without Epinephrine.    The spinal needle was inserted toward the target using a "trajectory" view along the fluoroscope beam.  Under AP and lateral visualization, the needle was advanced so it did not puncture dura. Biplanar projections were used to confirm position. Aspiration was confirmed to be negative for CSF and/or blood.  A 1-2 ml. volume of Isovue-250 was injected and flow of contrast was noted at each level. Radiographs were obtained for documentation purposes.   After attaining the desired flow of contrast documented above, a 0.5 to 1.0 ml test dose of 0.25% Marcaine was injected into each respective transforaminal space.  The patient was observed for 90 seconds post injection.  After no sensory deficits were reported, and normal lower extremity motor function was noted,   the  above injectate was administered so that equal amounts of the injectate were placed at each foramen (level) into the transforaminal epidural space.   Additional Comments:  The patient tolerated the procedure well Dressing: Band-Aid    Post-procedure details: Patient was observed during the procedure. Post-procedure instructions were reviewed.  Patient left the clinic in stable condition.    Clinical History: MRI LUMBAR SPINE WITHOUT CONTRAST  TECHNIQUE: Multiplanar, multisequence MR imaging of the lumbar spine was performed. No intravenous contrast was administered.  COMPARISON:  Previous MRI from 11/07/2012.  FINDINGS: Segmentation: Normal segmentation. Lowest well-formed disc labeled the L5-S1 level.  Alignment: Mild right convex scoliosis. Alignment otherwise normal with preservation of the normal lumbar lordosis.  Vertebrae: Vertebral body heights maintained without evidence for acute or chronic fracture. Bone marrow signal intensity within normal limits. No discrete or worrisome osseous lesions. No abnormal marrow edema.  Conus medullaris and cauda equina: Conus extends to the L1-2 level. Conus and cauda equina appear normal.  Paraspinal and other soft tissues: Paraspinous soft tissues within normal limits. Visualized visceral structures unremarkable.  Disc levels:  L1-2:  Unremarkable.  L2-3:  Unremarkable.  L3-4:  Unremarkable.  L4-5: Mild diffuse disc bulge with disc desiccation. Superimposed small central disc protrusion mildly indents the ventral thecal sac, slightly regressed from previous. Mild facet and ligament flavum hypertrophy. Resultant mild canal with bilateral lateral recess narrowing, slightly greater on the left. Foramina remain patent.  L5-S1: Chronic intervertebral disc space narrowing with diffuse disc bulge and disc desiccation. Mild reactive endplate changes. Superimposed broad central disc protrusion with associated  right subarticular component and inferior migration. Protruding disc contacts the descending S1 nerve roots bilaterally, with a small right subarticular component impinging upon the descending right S1 nerve root (series 6, image 36). Resultant moderate bilateral lateral recess stenosis, right worse than left. Overall, appearance is relatively unchanged from previous. Foramina remain patent.  IMPRESSION: 1. Central/right subarticular disc protrusion at L5-S1 with resultant moderate bilateral right lateral recess stenosis, right worse than left. Either of the descending S1 nerve roots could be affected. Appearance is similar to previous. 2. Shallow central disc protrusion at L4-5 with resultant mild canal and bilateral lateral recess stenosis. This disc protrusion has mildly regressed from previous.   Electronically Signed   By: Jeannine Boga M.D.   On: 12/15/2017 00:35   She reports that she has never smoked. She has never used smokeless tobacco. No results for input(s): HGBA1C, LABURIC in the last 8760 hours.  Objective:  VS:  HT:    WT:   BMI:     BP:(!) 132/92  HR:64bpm  TEMP:98.6 F (37 C)(Oral)  RESP:  Physical Exam  Ortho Exam Imaging: Xr C-arm No Report  Result Date: 01/22/2018 Please see Notes tab for imaging impression.   Past Medical/Family/Surgical/Social History: Medications & Allergies reviewed per EMR, new medications updated. Patient Active Problem List   Diagnosis Date Noted  . Osteoarthritis of spine with radiculopathy, lumbar region 11/20/2017  . Radiculitis 10/23/2017  . Fibroid    Past  Medical History:  Diagnosis Date  . Fibroid    Family History  Problem Relation Age of Onset  . Cancer Father   . Hypertension Mother   . Skin cancer Mother    Past Surgical History:  Procedure Laterality Date  . MOHS SURGERY  2016   SCALP  . TUBAL LIGATION    . WISDOM TOOTH EXTRACTION     Social History   Occupational History  . Not on  file  Tobacco Use  . Smoking status: Never Smoker  . Smokeless tobacco: Never Used  Substance and Sexual Activity  . Alcohol use: No  . Drug use: No  . Sexual activity: Yes    Partners: Male    Birth control/protection: Surgical

## 2018-01-22 NOTE — Progress Notes (Signed)
 .  Numeric Pain Rating Scale and Functional Assessment Average Pain 5   In the last MONTH (on 0-10 scale) has pain interfered with the following?  1. General activity like being  able to carry out your everyday physical activities such as walking, climbing stairs, carrying groceries, or moving a chair?  Rating(3)   +Driver, -BT, -Dye Allergies.  

## 2018-01-22 NOTE — Patient Instructions (Signed)

## 2018-01-22 NOTE — Patient Instructions (Signed)
CONSIDER EFFEXOR FOR BACK PAIN, DEPRESSION, ANXIETY, AND HOT FLASHES.

## 2018-01-22 NOTE — Progress Notes (Signed)
Subjective:    Destiny Frost is a 57 y.o. female and is here for a comprehensive physical exam.  Health Maintenance Due  Topic Date Due  . Hepatitis C Screening  August 14, 1960  . HIV Screening  10/22/1977  . COLONOSCOPY  10/22/2012   Current Outpatient Medications:  Marland Kitchen  Motrin prn.  PMHx, SurgHx, SocialHx, Medications, and Allergies were reviewed in the Visit Navigator and updated as appropriate.   Past Medical History:  Diagnosis Date  . Fibroid      Past Surgical History:  Procedure Laterality Date  . MOHS SURGERY  2016   SCALP  . TUBAL LIGATION    . WISDOM TOOTH EXTRACTION       Family History  Problem Relation Age of Onset  . Cancer Father   . Hypertension Mother   . Skin cancer Mother     Social History   Tobacco Use  . Smoking status: Never Smoker  . Smokeless tobacco: Never Used  Substance Use Topics  . Alcohol use: No  . Drug use: No   Review of Systems:   Pertinent items are noted in the HPI. Otherwise, ROS is negative.  Objective:   BP 140/82   Pulse 86   Temp 98.6 F (37 C) (Oral)   Ht 5\' 8"  (1.727 m)   Wt 209 lb 9.6 oz (95.1 kg)   SpO2 96%   BMI 31.87 kg/m   General appearance: alert, cooperative and appears stated age. Head: normocephalic, without obvious abnormality, atraumatic. Neck: no adenopathy, supple, symmetrical, trachea midline; thyroid not enlarged, symmetric, no tenderness/mass/nodules. Lungs: clear to auscultation bilaterally. Breasts: inspection negative, no nipple retraction or dimpling, no nipple discharge or bleeding, no axillary or supraclavicular adenopathy, normal to palpation without dominant masses. Heart: regular rate and rhythm Abdomen: soft, non-tender; no masses,  no organomegaly. Extremities: extremities normal, atraumatic, no cyanosis or edema. Skin: skin color, texture, turgor normal, no rashes or lesions. Lymph: cervical, supraclavicular, and axillary nodes normal; no abnormal inguinal nodes  palpated. Neurologic: grossly normal.  Assessment/Plan:   [name] was seen today for establish care.  Diagnoses and all orders for this visit:  Routine physical examination  Encounter for screening for HIV -     HIV Antibody (routine testing w rflx)  Encounter for hepatitis C virus screening test for high risk patient -     Hepatitis C antibody  Palpitations Comments: EKG WNL. Will monitor. Orders: -     CBC with Differential/Platelet -     Comprehensive metabolic panel -     T4, free -     TSH -     EKG 12-Lead  Screening for lipid disorders -     Lipid panel  Osteoarthritis of spine with radiculopathy, lumbar region Comments: Upcoming appointment with PMR. Will monitor. Offered daily pain control options. Orders: -     meloxicam (MOBIC) 15 MG tablet; Take 1 tablet (15 mg total) by mouth daily. -     baclofen (LIORESAL) 20 MG tablet; Take 1 tablet (20 mg total) by mouth 2 (two) times daily as needed for muscle spasms. -     ketorolac (TORADOL) 10 MG tablet; Take 1 tablet (10 mg total) by mouth every 6 (six) hours as needed. NOTE: PATIENT INSTRUCTED TO USE ONE NSAID ONLY.  Dyspepsia -     famotidine (PEPCID) 20 MG tablet; Take 1 tablet (20 mg total) by mouth 2 (two) times daily.  Patient Counseling: [x]    Nutrition: Stressed importance of moderation in sodium/caffeine  intake, saturated fat and cholesterol, caloric balance, sufficient intake of fresh fruits, vegetables, fiber, calcium, iron, and 1 mg of folate supplement per day (for females capable of pregnancy).  [x]    Stressed the importance of regular exercise.   [x]    Substance Abuse: Discussed cessation/primary prevention of tobacco, alcohol, or other drug use; driving or other dangerous activities under the influence; availability of treatment for abuse.   [x]    Injury prevention: Discussed safety belts, safety helmets, smoke detector, smoking near bedding or upholstery.   [x]    Sexuality: Discussed sexually  transmitted diseases, partner selection, use of condoms, avoidance of unintended pregnancy  and contraceptive alternatives.  [x]    Dental health: Discussed importance of regular tooth brushing, flossing, and dental visits.  [x]    Health maintenance and immunizations reviewed. Please refer to Health maintenance section.   Briscoe Deutscher, DO [place]

## 2018-01-22 NOTE — Procedures (Signed)
S1 Lumbosacral Transforaminal Epidural Steroid Injection - Sub-Pedicular Approach with Fluoroscopic Guidance   Patient: Destiny Frost      Date of Birth: 03/28/1961 MRN: [id number] PCP: Briscoe Deutscher, DO      Visit Date: 01/22/2018   Universal Protocol:    Date/Time: 09/24/194:34 PM  Consent Given By: the patient  Position:  PRONE  Additional Comments: Vital signs were monitored before and after the procedure. Patient was prepped and draped in the usual sterile fashion. The correct patient, procedure, and site was verified.   Injection Procedure Details:  Procedure Site One Meds Administered:  Meds ordered this encounter  Medications  . betamethasone acetate-betamethasone sodium phosphate (CELESTONE) injection 12 mg    Laterality: Left  Location/Site:  S1 Foramen   Needle size: 22 ga.  Needle type: Spinal  Needle Placement: Transforaminal  Findings:   -Comments: Excellent flow of contrast along the nerve and into the epidural space.  Procedure Details: After squaring off the sacral end-plate to get a true AP view, the C-arm was positioned so that the best possible view of the S1 foramen was visualized. The soft tissues overlying this structure were infiltrated with 2-3 ml. of 1% Lidocaine without Epinephrine.    The spinal needle was inserted toward the target using a "trajectory" view along the fluoroscope beam.  Under AP and lateral visualization, the needle was advanced so it did not puncture dura. Biplanar projections were used to confirm position. Aspiration was confirmed to be negative for CSF and/or blood. A 1-2 ml. volume of Isovue-250 was injected and flow of contrast was noted at each level. Radiographs were obtained for documentation purposes.   After attaining the desired flow of contrast documented above, a 0.5 to 1.0 ml test dose of 0.25% Marcaine was injected into each respective transforaminal space.  The patient was observed for 90 seconds post  injection.  After no sensory deficits were reported, and normal lower extremity motor function was noted,   the above injectate was administered so that equal amounts of the injectate were placed at each foramen (level) into the transforaminal epidural space.   Additional Comments:  The patient tolerated the procedure well Dressing: Band-Aid    Post-procedure details: Patient was observed during the procedure. Post-procedure instructions were reviewed.  Patient left the clinic in stable condition.

## 2018-01-23 DIAGNOSIS — D2230 Melanocytic nevi of unspecified part of face: Secondary | ICD-10-CM | POA: Diagnosis not present

## 2018-01-23 DIAGNOSIS — Z85828 Personal history of other malignant neoplasm of skin: Secondary | ICD-10-CM | POA: Diagnosis not present

## 2018-01-23 DIAGNOSIS — D225 Melanocytic nevi of trunk: Secondary | ICD-10-CM | POA: Diagnosis not present

## 2018-01-23 LAB — HIV ANTIBODY (ROUTINE TESTING W REFLEX): HIV 1&2 Ab, 4th Generation: NONREACTIVE

## 2018-01-23 LAB — HEPATITIS C ANTIBODY
Hepatitis C Ab: NONREACTIVE
SIGNAL TO CUT-OFF: 0.05 (ref <1.00)

## 2018-02-04 DIAGNOSIS — Z6831 Body mass index (BMI) 31.0-31.9, adult: Secondary | ICD-10-CM | POA: Diagnosis not present

## 2018-02-04 DIAGNOSIS — M5416 Radiculopathy, lumbar region: Secondary | ICD-10-CM | POA: Diagnosis not present

## 2018-02-05 ENCOUNTER — Encounter: Payer: Self-pay | Admitting: Family Medicine

## 2018-02-12 ENCOUNTER — Ambulatory Visit: Payer: BLUE CROSS/BLUE SHIELD | Admitting: Sports Medicine

## 2018-02-15 DIAGNOSIS — M5127 Other intervertebral disc displacement, lumbosacral region: Secondary | ICD-10-CM | POA: Diagnosis not present

## 2018-02-15 DIAGNOSIS — M4807 Spinal stenosis, lumbosacral region: Secondary | ICD-10-CM | POA: Diagnosis not present

## 2018-03-08 ENCOUNTER — Other Ambulatory Visit: Payer: Self-pay

## 2018-03-08 ENCOUNTER — Ambulatory Visit: Payer: BLUE CROSS/BLUE SHIELD | Attending: Neurological Surgery | Admitting: Physical Therapy

## 2018-03-08 ENCOUNTER — Encounter: Payer: Self-pay | Admitting: Physical Therapy

## 2018-03-08 DIAGNOSIS — M25652 Stiffness of left hip, not elsewhere classified: Secondary | ICD-10-CM | POA: Diagnosis not present

## 2018-03-08 DIAGNOSIS — M6281 Muscle weakness (generalized): Secondary | ICD-10-CM | POA: Insufficient documentation

## 2018-03-08 DIAGNOSIS — M25552 Pain in left hip: Secondary | ICD-10-CM | POA: Insufficient documentation

## 2018-03-08 NOTE — Therapy (Signed)
[facility] [address] [place], [place], [address] Phone: [phone]   Fax:  [phone]  Physical Therapy Evaluation  Patient Details  Name: Destiny Frost MRN: [id number] Date of Birth: 15-Nov-1960 Referring Provider (PT): Eustace Moore, MD   Encounter Date: 03/08/2018  PT End of Session - 03/08/18 2118    Visit Number  1    Date for PT Re-Evaluation  05/03/18    Authorization Type  BCBS    PT Start Time  0930    PT Stop Time  1016    PT Time Calculation (min)  46 min    Activity Tolerance  Patient tolerated treatment well    Behavior During Therapy  [facility] for tasks assessed/performed       Past Medical History:  Diagnosis Date  . Fibroid     Past Surgical History:  Procedure Laterality Date  . MOHS SURGERY  2016   SCALP  . TUBAL LIGATION    . WISDOM TOOTH EXTRACTION      There were no vitals filed for this visit.   Subjective Assessment - 03/08/18 0932    Subjective  I had laminectomy surgery on L5-S1 3 weeks ago today.  She is having pain/numbness at times on the outside of left heel and foot (numb) and top of thigh on left side pain.  Pain is almost all gone in the thigh.  The back where the incision is, very uncomfortable especially when driving.      Pertinent History  lumbar surgery 10/18 (cleared from precautions but take it easy)    Limitations  Sitting;Lifting    Patient Stated Goals  regain strength in core, pelvic, for walking and return to normal activities    Currently in Pain?  No/denies         [facility] PT Assessment - 03/08/18 0001      Assessment   Medical Diagnosis  M54.16 (ICD-10-CM) - Radiculopathy, lumbar region    Referring Provider (PT)  Eustace Moore, MD    Onset Date/Surgical Date  02/15/18   back surgery   Prior Therapy  yes      Precautions   Precautions  Back    Precaution Comments  release from back precautions but taking it easy      Restrictions   Weight  Bearing Restrictions  No      Balance Screen   Has the patient fallen in the past 6 months  No      [place] residence    Living Arrangements  Spouse/significant other      Prior Function   Level of Independence  Independent      Cognition   Overall Cognitive Status  Within Functional Limits for tasks assessed      Posture/Postural Control   Posture/Postural Control  Postural limitations    Postural Limitations  Anterior pelvic tilt;Rounded Shoulders    Posture Comments  left hip elevated in standing (uses heel lift), minimal Rt leg increased length observed in supine      ROM / Strength   AROM / PROM / Strength  AROM;Strength      AROM   Overall AROM Comments  lumbar flexion 60% limited; left hip ER 50% limited      Strength   Strength Assessment Site  Hip    Right/Left Hip  Left    Left Hip Extension  4/5    Left Hip External Rotation  4/5    Left Hip ABduction  4/5      Ambulation/Gait   Gait Pattern  Decreased step length - left;Decreased stance time - right;Decreased stride length                Objective measurements completed on examination: See above findings.    Pelvic Floor Special Questions - 03/08/18 0001    Prior Pelvic/Prostate Exam  Yes    Are you Pregnant or attempting pregnancy?  No    Prior Pregnancies  Yes    Number of Pregnancies  4    Number of Vaginal Deliveries  4    Any difficulty with labor and deliveries  Yes    Currently Sexually Active  Yes    Is this Painful  No    Urinary Leakage  No    Urinary urgency  Yes    Urinary frequency  nocturia 2x    Fecal incontinence  No    Falling out feeling (prolapse)  No    Skin Integrity  Intact    Perineal Body/Introitus   Descended    Prolapse  Anterior Wall    Pelvic Floor Internal Exam  pt identity confirmed and consent was given to perform internal soft tissue assessment    Exam Type  Vaginal    Palpation  greater tone in levator muscles, low  tone bulbocavernosis    Strength  Flicker    Strength # of reps  3    Strength # of seconds  2    Tone  low       [facility] Adult PT Treatment/Exercise - 03/08/18 0001      Self-Care   Self-Care  Other Self-Care Comments    Other Self-Care Comments   initial HEP and urge to void             PT Education - 03/08/18 2118    Education Details  urge to void;  Access Code: [email or web address]     Person(s) Educated  Patient    Methods  Explanation;Demonstration;Handout;Verbal cues    Comprehension  Verbalized understanding;Returned demonstration       PT Short Term Goals - 03/08/18 2117      PT SHORT TERM GOAL #1   Title  able to perform kegel correctly    Time  2    Period  Weeks    Status  New    Target Date  03/22/18      PT SHORT TERM GOAL #2   Title  ind with initial HEP    Time  2    Period  Weeks    Status  New    Target Date  03/22/18      PT SHORT TERM GOAL #3   Title  reports only getting up 1x/night at most to void    Time  4    Period  Weeks    Status  New    Target Date  04/05/18        PT Long Term Goals - 03/08/18 2110      PT LONG TERM GOAL #1   Title  pt will report she is able to have a 30 minute window before having to get to the bathroom after the initial urge to void    Baseline  10 min    Time  8    Period  Weeks    Status  New    Target Date  05/03/18      PT LONG TERM  GOAL #2   Title  Pt will be able to sustain pelvic floor contraction of 2/5 MMT for at least 5 seconds to control urge to void.     Baseline  1/5 for 2 seconds    Time  8    Period  Weeks    Status  New    Target Date  05/03/18      PT LONG TERM GOAL #3   Title  Pt will be able to return to normal walking routine due to improved LE and core strength    Time  8    Period  Weeks    Status  New    Target Date  05/03/18      PT LONG TERM GOAL #4   Title  Pt will be ind with advanced HEP    Time  8    Period  Weeks    Status  New    Target Date  05/03/18      PT LONG  TERM GOAL #5   Title  FOTO for back will be < or = to 39% limited    Baseline  starting at 52% limited    Time  8    Period  Weeks    Status  New    Target Date  05/03/18             Plan - 03/08/18 2120    Clinical Impression Statement  Pt presents to clinic due to pain, weakness and numbness since recent back surgery.  She reports it is feeling better than prior to surgery, but she is not sure how to return to prior level of funciton.  Pt demonstrates decreased lumbar flexion and left hip ER and flexion.  Pt also reports feeling weakness in left LE as well as pelvic floor.  Pt demonstrates only a flcker with circular pelvic floor contraciton.  She demonstrates some bladder prolapse with bulging of pelvic floor.  Pt has core weakness as demonstrated with instability during SLR that was corrected with pelvic compression.  Pt will benefit from skilled PT to address impairments and return to maximum function and activities.    History and Personal Factors relevant to plan of care:  L5-S1 laminectomy 02/15/18, 4 vaginal deliveries (3 lasting over 20 hours)    Clinical Presentation  Stable    Clinical Presentation due to:  pt is stable    Clinical Decision Making  Moderate    Rehab Potential  Excellent    PT Frequency  2x / week    PT Duration  8 weeks    PT Treatment/Interventions  ADLs/Self Care Home Management;Biofeedback;Cryotherapy;Electrical Stimulation;Moist Heat;Therapeutic exercise;Therapeutic activities;Neuromuscular re-education;Patient/family education;Manual techniques;Scar mobilization;Taping;Dry needling    PT Next Visit Plan  review any HEP as needed, lumbar and hip stretches, biofeedback/cues for circular contraction, initiate TrA/ core training    PT Home Exercise Plan   Access Code: [email or web address]     Recommended Other Services  eval 11/8       Patient will benefit from skilled therapeutic intervention in order to improve the following deficits and impairments:  Pain,  Abnormal gait, Increased fascial restricitons, Decreased range of motion, Decreased strength, Postural dysfunction  Visit Diagnosis: Muscle weakness (generalized)  Pain in left hip  Stiffness of left hip, not elsewhere classified     Problem List Patient Active Problem List   Diagnosis Date Noted  . Osteoarthritis of spine with radiculopathy, lumbar region 11/20/2017  . Radiculitis 10/23/2017  . Fibroid  Zannie Cove, PT 03/08/2018, 9:42 PM  [facility] [address] [place], [place], [address] Phone: [phone]   Fax:  [phone]  Name: ANIE JUNIEL MRN: [id number] Date of Birth: 12-20-1960

## 2018-03-08 NOTE — Patient Instructions (Signed)

## 2018-03-12 ENCOUNTER — Encounter: Payer: Self-pay | Admitting: Physical Therapy

## 2018-03-12 ENCOUNTER — Ambulatory Visit: Payer: BLUE CROSS/BLUE SHIELD | Admitting: Physical Therapy

## 2018-03-12 DIAGNOSIS — M25552 Pain in left hip: Secondary | ICD-10-CM | POA: Diagnosis not present

## 2018-03-12 DIAGNOSIS — M6281 Muscle weakness (generalized): Secondary | ICD-10-CM | POA: Diagnosis not present

## 2018-03-12 DIAGNOSIS — M25652 Stiffness of left hip, not elsewhere classified: Secondary | ICD-10-CM | POA: Diagnosis not present

## 2018-03-12 NOTE — Patient Instructions (Signed)
Access Code: [email or web address]  URL: [email or web address]  Date: 03/12/2018  Prepared by: Earlie Counts   Exercises  Supine Hamstring Stretch - 3 reps - 1 sets - 30 sec hold - 1x daily - 7x weekly  Seated Hamstring Stretch - 3 reps - 1 sets - 30 sec hold - 1x daily - 7x weekly  Supine Figure 4 Piriformis Stretch - 3 reps - 1 sets - 30 sec hold - 1x daily - 7x weekly  Supine Bilateral Hip Internal Rotation Stretch - 3 reps - 1 sets - 30 sec hold - 1x daily - 7x weekly  Supine Butterfly Groin Stretch - 3 reps - 1 sets - 30 sec hold - 1x daily - 7x weekly  Supine Transversus Abdominis Bracing - Hands on Stomach - 10 reps - 1 sets - 5 sec hold - 1x daily - 7x weekly  Supine Hip Adduction Isometric with Ball - 5 reps - 1 sets - 5 sec hold - 2x daily - 7x weekly  Supine Transversus Abdominis Bracing with Double Leg Fallout - 10 reps - 1 sets - 1x daily - 7x weekly  Hooklying Isometric Hip Flexion - 10 reps - 1 sets - 5 sec hold - 1x daily - 7x weekly  [facility] [address], [place], [place] [address] Phone # [phone] Fax [phone]

## 2018-03-12 NOTE — Therapy (Signed)
[facility] [address], [place], [place], [address] Phone: [phone]   Fax:  [phone]  Physical Therapy Treatment  Patient Details  Name: Destiny Frost MRN: [id number] Date of Birth: [birth year] Referring Provider (PT): Eustace Moore, MD   Encounter Date: 03/12/2018  PT End of Session - 03/12/18 1238    Visit Number  2    Date for PT Re-Evaluation  05/03/18    Authorization Type  BCBS    PT Start Time  1761    PT Stop Time  1226    PT Time Calculation (min)  41 min    Activity Tolerance  Patient tolerated treatment well;No increased pain    Behavior During Therapy  WFL for tasks assessed/performed       Past Medical History:  Diagnosis Date  . Fibroid     Past Surgical History:  Procedure Laterality Date  . MOHS SURGERY  2016   SCALP  . TUBAL LIGATION    . WISDOM TOOTH EXTRACTION      There were no vitals filed for this visit.  Subjective Assessment - 03/12/18 1147    Subjective  My back is feeling better. I still have numbness on the outside of my foot. Coccyx is uncomfortable/     Pertinent History  lumbar surgery 10/18 (cleared from precautions but take it easy)    Limitations  Sitting;Lifting    Patient Stated Goals  regain strength in core, pelvic, for walking and return to normal activities    Currently in Pain?  Yes    Pain Score  2     Pain Location  Back    Pain Orientation  Lower    Pain Descriptors / Indicators  Pressure    Pain Type  Chronic pain    Pain Onset  More than a month ago    Pain Frequency  Intermittent    Aggravating Factors   sitting    Pain Relieving Factors  walk, rest    Multiple Pain Sites  No                       [facility] Adult PT Treatment/Exercise - 03/12/18 0001      Exercises   Exercises  Lumbar      Lumbar Exercises: Supine   Ab Set  10 reps;5 seconds    Clam  10 reps;1 second    Isometric Hip Flexion  20 reps;5 seconds    Other  Supine Lumbar Exercises  ball squeeze hold 5 seconds with pelvic floor contraction      Manual Therapy   Manual Therapy  Soft tissue mobilization    Manual therapy comments  used assistive device to lift up tissue and separate the fascia in the thoracic lumbar area    Soft tissue mobilization  scar massage to lumbar area to improve mobility and flatten scar; soft tissue work to left gluteal, left pirirfomris, left coccygeus, bil. lumbar thoracic lumbar paraspinals             PT Education - 03/12/18 1228    Education Details  Access Code: [email or web address]     Person(s) Educated  Patient    Methods  Explanation;Demonstration;Verbal cues;Handout    Comprehension  Verbalized understanding;Returned demonstration       PT Short Term Goals - 03/12/18 1239      PT SHORT TERM GOAL #1   Title  able to perform kegel correctly    Time  2    Period  Weeks    Status  On-going      PT SHORT TERM GOAL #2   Title  ind with initial HEP    Time  2    Period  Weeks    Status  On-going      PT SHORT TERM GOAL #3   Title  reports only getting up 1x/night at most to void    Time  4    Period  Weeks    Status  On-going        PT Long Term Goals - 03/08/18 2110      PT LONG TERM GOAL #1   Title  pt will report she is able to have a 30 minute window before having to get to the bathroom after the initial urge to void    Baseline  10 min    Time  8    Period  Weeks    Status  New    Target Date  05/03/18      PT LONG TERM GOAL #2   Title  Pt will be able to sustain pelvic floor contraction of 2/5 MMT for at least 5 seconds to control urge to void.     Baseline  1/5 for 2 seconds    Time  8    Period  Weeks    Status  New    Target Date  05/03/18      PT LONG TERM GOAL #3   Title  Pt will be able to return to normal walking routine due to improved LE and core strength    Time  8    Period  Weeks    Status  New    Target Date  05/03/18      PT LONG TERM GOAL #4   Title  Pt will be  ind with advanced HEP    Time  8    Period  Weeks    Status  New    Target Date  05/03/18      PT LONG TERM GOAL #5   Title  FOTO for back will be < or = to 39% limited    Baseline  starting at 52% limited    Time  8    Period  Weeks    Status  New    Target Date  05/03/18            Plan - 03/12/18 1153    Clinical Impression Statement  Patient has tightness around her scar and on the scar.  Patient had increased muscle tension on  the left gluteal, left coccygeus, left piriformis, bilateral lumbar paraspinals. Patient is able to stretch and move his left hip with greater ease. Patient will be flying to [place] tomorrow with a direct flight.  Patient is feeling the pelvic floor muscles contract more. Patient had increased in tissue mobility around the scar. Patient will benefit from skilled therapy  to address impairments and return to maximum function and activities.     Rehab Potential  Excellent    PT Frequency  2x / week    PT Duration  8 weeks    PT Treatment/Interventions  ADLs/Self Care Home Management;Biofeedback;Cryotherapy;Electrical Stimulation;Moist Heat;Therapeutic exercise;Therapeutic activities;Neuromuscular re-education;Patient/family education;Manual techniques;Scar mobilization;Taping;Dry needling    PT Next Visit Plan  review any HEP as needed, lumbar and hip stretches, biofeedback/cues for circular contraction, initiate TrA/ core training    PT Home Exercise Plan   Access Code: [facility]  Consulted and Agree with Plan of Care  Patient       Patient will benefit from skilled therapeutic intervention in order to improve the following deficits and impairments:  Pain, Abnormal gait, Increased fascial restricitons, Decreased range of motion, Decreased strength, Postural dysfunction  Visit Diagnosis: Muscle weakness (generalized)  Pain in left hip  Stiffness of left hip, not elsewhere classified     Problem List Patient Active Problem List   Diagnosis  Date Noted  . Osteoarthritis of spine with radiculopathy, lumbar region 11/20/2017  . Radiculitis 10/23/2017  . Fibroid     Earlie Counts, PT 03/12/18 12:40 PM   [facility] [address], [place], [place], [address] Phone: [phone]   Fax:  [phone]  Name: KYLER LERETTE MRN: [id number] Date of Birth: 1961-03-12

## 2018-03-20 ENCOUNTER — Ambulatory Visit: Payer: BLUE CROSS/BLUE SHIELD | Admitting: Physical Therapy

## 2018-03-20 ENCOUNTER — Encounter: Payer: Self-pay | Admitting: Physical Therapy

## 2018-03-20 DIAGNOSIS — M6281 Muscle weakness (generalized): Secondary | ICD-10-CM

## 2018-03-20 DIAGNOSIS — M25552 Pain in left hip: Secondary | ICD-10-CM | POA: Diagnosis not present

## 2018-03-20 DIAGNOSIS — M25652 Stiffness of left hip, not elsewhere classified: Secondary | ICD-10-CM

## 2018-03-20 NOTE — Therapy (Signed)
[facility] [address], [place], [place], [address] Phone: [phone]   Fax:  [phone]  Physical Therapy Treatment  Patient Details  Name: Destiny Frost MRN: [id number] Date of Birth: [birth year] Referring Provider (PT): Eustace Moore, MD   Encounter Date: 03/20/2018  PT End of Session - 03/20/18 1100    Visit Number  3    Date for PT Re-Evaluation  05/03/18    Authorization Type  BCBS    PT Start Time  1100    PT Stop Time  1145    PT Time Calculation (min)  45 min    Activity Tolerance  Patient tolerated treatment well;No increased pain    Behavior During Therapy  WFL for tasks assessed/performed       Past Medical History:  Diagnosis Date  . Fibroid     Past Surgical History:  Procedure Laterality Date  . MOHS SURGERY  2016   SCALP  . TUBAL LIGATION    . WISDOM TOOTH EXTRACTION      There were no vitals filed for this visit.  Subjective Assessment - 03/20/18 1103    Subjective  My back is feeling better. My foot is not as numb.  Tailbone still feels sore.    Patient Stated Goals  regain strength in core, pelvic, for walking and return to normal activities    Currently in Pain?  No/denies                       [facility] Adult PT Treatment/Exercise - 03/20/18 0001      Neuro Re-ed    Neuro Re-ed Details   sitting on ball circles both ways, contract and relax pelvic floor; LAQ 5x each side      Exercises   Exercises  Lumbar      Lumbar Exercises: Standing   Other Standing Lumbar Exercises  hip ext, abduction, adduction, flexion - 5 x each side yellow band    Other Standing Lumbar Exercises  small step down - cues to keep pelvis level and not bring knee past toe - 10 x each side      Manual Therapy   Manual Therapy  Internal Pelvic Floor    Manual therapy comments  pt identity confirmed and consent given to perform internal soft tissue assessment and treatment    Internal  Pelvic Floor  bilateral coccygeus and obdurator internus               PT Short Term Goals - 03/12/18 1239      PT SHORT TERM GOAL #1   Title  able to perform kegel correctly    Time  2    Period  Weeks    Status  On-going      PT SHORT TERM GOAL #2   Title  ind with initial HEP    Time  2    Period  Weeks    Status  On-going      PT SHORT TERM GOAL #3   Title  reports only getting up 1x/night at most to void    Time  4    Period  Weeks    Status  On-going        PT Long Term Goals - 03/08/18 2110      PT LONG TERM GOAL #1   Title  pt will report she is able to have a 30 minute window before having to get to the  bathroom after the initial urge to void    Baseline  10 min    Time  8    Period  Weeks    Status  New    Target Date  05/03/18      PT LONG TERM GOAL #2   Title  Pt will be able to sustain pelvic floor contraction of 2/5 MMT for at least 5 seconds to control urge to void.     Baseline  1/5 for 2 seconds    Time  8    Period  Weeks    Status  New    Target Date  05/03/18      PT LONG TERM GOAL #3   Title  Pt will be able to return to normal walking routine due to improved LE and core strength    Time  8    Period  Weeks    Status  New    Target Date  05/03/18      PT LONG TERM GOAL #4   Title  Pt will be ind with advanced HEP    Time  8    Period  Weeks    Status  New    Target Date  05/03/18      PT LONG TERM GOAL #5   Title  FOTO for back will be < or = to 39% limited    Baseline  starting at 52% limited    Time  8    Period  Weeks    Status  New    Target Date  05/03/18            Plan - 03/20/18 1300    Clinical Impression Statement  Pt had tightness and tenderness in coccygeus and obdurator muscles.  No pain after STM to muscles.  Pt has some weakness in bilateral hips and needs cues during single leg exercises to keep pelvic neutral .  Pt is demonstrating improvement overall and was able to do a lot of walking in  [place] without limitations.  Pt will benefit from skilled PT to continue strengthening and STM to return to maximum function.    PT Treatment/Interventions  ADLs/Self Care Home Management;Biofeedback;Cryotherapy;Electrical Stimulation;Moist Heat;Therapeutic exercise;Therapeutic activities;Neuromuscular re-education;Patient/family education;Manual techniques;Scar mobilization;Taping;Dry needling    PT Next Visit Plan  review any HEP as needed, lumbar and hip stretches, progress TrA/ core training    PT Home Exercise Plan   Access Code: [email or web address]     Recommended Other Services  eval 11/18 - orders signed    Consulted and Agree with Plan of Care  Patient       Patient will benefit from skilled therapeutic intervention in order to improve the following deficits and impairments:  Pain, Abnormal gait, Increased fascial restricitons, Decreased range of motion, Decreased strength, Postural dysfunction  Visit Diagnosis: Muscle weakness (generalized)  Pain in left hip  Stiffness of left hip, not elsewhere classified     Problem List Patient Active Problem List   Diagnosis Date Noted  . Osteoarthritis of spine with radiculopathy, lumbar region 11/20/2017  . Radiculitis 10/23/2017  . Fibroid     Zannie Cove, PT 03/20/2018, 1:10 PM  [facility] [address], [place], [place], [address] Phone: [phone]   Fax:  [phone]  Name: Destiny Frost MRN: [id number] Date of Birth: [birth year]

## 2018-03-21 ENCOUNTER — Encounter: Payer: Self-pay | Admitting: Family Medicine

## 2018-03-21 ENCOUNTER — Encounter: Payer: Self-pay | Admitting: *Deleted

## 2018-03-22 ENCOUNTER — Encounter: Payer: Self-pay | Admitting: Physical Therapy

## 2018-03-22 ENCOUNTER — Ambulatory Visit: Payer: BLUE CROSS/BLUE SHIELD | Admitting: Physical Therapy

## 2018-03-22 DIAGNOSIS — M25552 Pain in left hip: Secondary | ICD-10-CM

## 2018-03-22 DIAGNOSIS — M6281 Muscle weakness (generalized): Secondary | ICD-10-CM

## 2018-03-22 DIAGNOSIS — M25652 Stiffness of left hip, not elsewhere classified: Secondary | ICD-10-CM | POA: Diagnosis not present

## 2018-03-22 NOTE — Therapy (Signed)
[facility] [address] [place], [place], [address] Phone: [phone]   Fax:  [phone]  Physical Therapy Treatment  Patient Details  Name: Destiny Frost MRN: [id number] Date of Birth: 02/26/1961 Referring Provider (PT): Eustace Moore, MD   Encounter Date: 03/22/2018  PT End of Session - 03/22/18 1218    Visit Number  4    Date for PT Re-Evaluation  05/03/18    Authorization Type  BCBS    PT Start Time  1018    PT Stop Time  1056    PT Time Calculation (min)  38 min    Activity Tolerance  Patient tolerated treatment well;No increased pain    Behavior During Therapy  WFL for tasks assessed/performed       Past Medical History:  Diagnosis Date  . Fibroid   . Seasonal allergies     Past Surgical History:  Procedure Laterality Date  . MOHS SURGERY  2016   SCALP  . TUBAL LIGATION    . WISDOM TOOTH EXTRACTION      There were no vitals filed for this visit.  Subjective Assessment - 03/22/18 1227    Subjective  Feels a little better after the internal soft tissue.  incision still very sore with pressure on it.  I can feel my pelvic floor muscles engaging more now.    Patient Stated Goals  regain strength in core, pelvic, for walking and return to normal activities    Currently in Pain?  No/denies                       [facility] Adult PT Treatment/Exercise - 03/22/18 0001      Neuro Re-ed    Neuro Re-ed Details   breathing, TrA and PF contraction with quadruped ex      Exercises   Exercises  Lumbar      Lumbar Exercises: Quadruped   Madcat/Old Horse  10 reps    Single Arm Raise  10 reps;Right;Left      Manual Therapy   Soft tissue mobilization  scar massage to lumbar area to improve mobility and flatten scar; soft tissue work to bilateral gluteal, lumbar thoracic lumbar paraspinals               PT Short Term Goals - 03/22/18 1227      PT SHORT TERM GOAL #1   Title   able to perform kegel correctly    Status  Achieved      PT SHORT TERM GOAL #2   Title  ind with initial HEP    Status  Achieved      PT SHORT TERM GOAL #3   Title  reports only getting up 1x/night at most to void    Status  On-going        PT Long Term Goals - 03/08/18 2110      PT LONG TERM GOAL #1   Title  pt will report she is able to have a 30 minute window before having to get to the bathroom after the initial urge to void    Baseline  10 min    Time  8    Period  Weeks    Status  New    Target Date  05/03/18      PT LONG TERM GOAL #2   Title  Pt will be able to sustain pelvic floor contraction of 2/5 MMT for at least 5 seconds  to control urge to void.     Baseline  1/5 for 2 seconds    Time  8    Period  Weeks    Status  New    Target Date  05/03/18      PT LONG TERM GOAL #3   Title  Pt will be able to return to normal walking routine due to improved LE and core strength    Time  8    Period  Weeks    Status  New    Target Date  05/03/18      PT LONG TERM GOAL #4   Title  Pt will be ind with advanced HEP    Time  8    Period  Weeks    Status  New    Target Date  05/03/18      PT LONG TERM GOAL #5   Title  FOTO for back will be < or = to 39% limited    Baseline  starting at 52% limited    Time  8    Period  Weeks    Status  New    Target Date  05/03/18            Plan - 03/22/18 1219    Clinical Impression Statement  Pt is feeling better overall, still having pain at incision.  Pt has significnt fascial restrictions around incision and got some increased soft tissue length with scar massage and myofascial stretching to incision.  Pt did well with ROM and no increased pain.  She was able to engage TrA correctly in quadruped position.  Pt will benefit from skilled PT to continue sof ttissue mobs and ROM along with strengthening for improved functional activities.      PT Treatment/Interventions  ADLs/Self Care Home  Management;Biofeedback;Cryotherapy;Electrical Stimulation;Moist Heat;Therapeutic exercise;Therapeutic activities;Neuromuscular re-education;Patient/family education;Manual techniques;Scar mobilization;Taping;Dry needling    PT Next Visit Plan  review any HEP as needed, lumbar and hip stretches, progress TrA/ core training, coccyx mobs and internal STM as needed for restrictions around coccyx    PT Home Exercise Plan   Access Code: [email or web address]     Consulted and Agree with Plan of Care  Patient       Patient will benefit from skilled therapeutic intervention in order to improve the following deficits and impairments:  Pain, Abnormal gait, Increased fascial restricitons, Decreased range of motion, Decreased strength, Postural dysfunction  Visit Diagnosis: Muscle weakness (generalized)  Pain in left hip  Stiffness of left hip, not elsewhere classified     Problem List Patient Active Problem List   Diagnosis Date Noted  . Osteoarthritis of spine with radiculopathy, lumbar region 11/20/2017  . Radiculitis 10/23/2017  . Fibroid     Zannie Cove, PT 03/22/2018, 12:31 PM  [facility] [address], [place], [place], [address] Phone: [phone]   Fax:  [phone]  Name: Destiny Frost MRN: [id number] Date of Birth: Jul 30, 1960

## 2018-03-25 ENCOUNTER — Encounter: Payer: Self-pay | Admitting: Physical Therapy

## 2018-03-25 ENCOUNTER — Ambulatory Visit: Payer: BLUE CROSS/BLUE SHIELD | Admitting: Physical Therapy

## 2018-03-25 DIAGNOSIS — M25652 Stiffness of left hip, not elsewhere classified: Secondary | ICD-10-CM

## 2018-03-25 DIAGNOSIS — M6281 Muscle weakness (generalized): Secondary | ICD-10-CM | POA: Diagnosis not present

## 2018-03-25 DIAGNOSIS — M25552 Pain in left hip: Secondary | ICD-10-CM | POA: Diagnosis not present

## 2018-03-25 NOTE — Therapy (Signed)
[facility] [address], [place], [place], [address] Phone: [phone]   Fax:  [phone]  Physical Therapy Treatment  Patient Details  Name: Destiny Frost MRN: [id number] Date of Birth: August 16, 1960 Referring Provider (PT): Eustace Moore, MD   Encounter Date: 03/25/2018  PT End of Session - 03/25/18 1229    Visit Number  5    Date for PT Re-Evaluation  05/03/18    Authorization Type  BCBS    PT Start Time  1147    PT Stop Time  1227    PT Time Calculation (min)  40 min    Activity Tolerance  Patient tolerated treatment well;No increased pain    Behavior During Therapy  WFL for tasks assessed/performed       Past Medical History:  Diagnosis Date  . Fibroid   . Seasonal allergies     Past Surgical History:  Procedure Laterality Date  . MOHS SURGERY  2016   SCALP  . TUBAL LIGATION    . WISDOM TOOTH EXTRACTION      There were no vitals filed for this visit.  Subjective Assessment - 03/25/18 1148    Subjective  Pt states the back felt a little worse on Friday after the scar massage but then felt better over the weekend.  States tailbone is not hurting as much and acutally zero pain today, and it is easier to put socks on.    Patient Stated Goals  regain strength in core, pelvic, for walking and return to normal activities    Currently in Pain?  No/denies                       [facility] Adult PT Treatment/Exercise - 03/25/18 0001      Neuro Re-ed    Neuro Re-ed Details   breathing, TrA and PF contraction with quadruped ex      Exercises   Exercises  Lumbar      Lumbar Exercises: Stretches   Active Hamstring Stretch  Right;Left;30 seconds;3 reps    Gastroc Stretch  Right;Left;30 seconds;3 reps      Lumbar Exercises: Standing   Other Standing Lumbar Exercises  mini step down - forward - 10 x      Lumbar Exercises: Quadruped   Madcat/Old Horse  10 reps    Single Arm Raise  10  reps;Right;Left      Manual Therapy   Soft tissue mobilization  scar massage to lumbar area to improve mobility and flatten scar; soft tissue work to bilateral gluteal, lumbar thoracic lumbar paraspinals               PT Short Term Goals - 03/22/18 1227      PT SHORT TERM GOAL #1   Title  able to perform kegel correctly    Status  Achieved      PT SHORT TERM GOAL #2   Title  ind with initial HEP    Status  Achieved      PT SHORT TERM GOAL #3   Title  reports only getting up 1x/night at most to void    Status  On-going        PT Long Term Goals - 03/08/18 2110      PT LONG TERM GOAL #1   Title  pt will report she is able to have a 30 minute window before having to get to the bathroom after the initial urge to void  Baseline  10 min    Time  8    Period  Weeks    Status  New    Target Date  05/03/18      PT LONG TERM GOAL #2   Title  Pt will be able to sustain pelvic floor contraction of 2/5 MMT for at least 5 seconds to control urge to void.     Baseline  1/5 for 2 seconds    Time  8    Period  Weeks    Status  New    Target Date  05/03/18      PT LONG TERM GOAL #3   Title  Pt will be able to return to normal walking routine due to improved LE and core strength    Time  8    Period  Weeks    Status  New    Target Date  05/03/18      PT LONG TERM GOAL #4   Title  Pt will be ind with advanced HEP    Time  8    Period  Weeks    Status  New    Target Date  05/03/18      PT LONG TERM GOAL #5   Title  FOTO for back will be < or = to 39% limited    Baseline  starting at 52% limited    Time  8    Period  Weeks    Status  New    Target Date  05/03/18            Plan - 03/25/18 1230    Clinical Impression Statement  Pt continues to demonstrate progress with less pain and greater ROM.  Pt still has a lot of adhesions around incision.  She was educated in using lotion around incision which appeared a little dry.  Pt is doing well with exercises.   Calf stretch was added to HEP and patient felt fascial stretch all the way into her left hip.  Pt will benefit from skilled PT to continue working on core and pelvic floor strength progression.    PT Treatment/Interventions  ADLs/Self Care Home Management;Biofeedback;Cryotherapy;Electrical Stimulation;Moist Heat;Therapeutic exercise;Therapeutic activities;Neuromuscular re-education;Patient/family education;Manual techniques;Scar mobilization;Taping;Dry needling    PT Next Visit Plan  lumbar and hip stretches, TrA and core strength,     PT Home Exercise Plan   Access Code: [email or web address]     Consulted and Agree with Plan of Care  Patient       Patient will benefit from skilled therapeutic intervention in order to improve the following deficits and impairments:  Pain, Abnormal gait, Increased fascial restricitons, Decreased range of motion, Decreased strength, Postural dysfunction  Visit Diagnosis: Muscle weakness (generalized)  Pain in left hip  Stiffness of left hip, not elsewhere classified     Problem List Patient Active Problem List   Diagnosis Date Noted  . Osteoarthritis of spine with radiculopathy, lumbar region 11/20/2017  . Radiculitis 10/23/2017  . Fibroid     Zannie Cove, PT 03/25/2018, 12:49 PM  [facility] [address], [place], [place], [address] Phone: [phone]   Fax:  [phone]  Name: SHIRLEYMAE HAUTH MRN: [id number] Date of Birth: Sep 20, 1960

## 2018-04-01 ENCOUNTER — Ambulatory Visit: Payer: BLUE CROSS/BLUE SHIELD | Attending: Neurological Surgery | Admitting: Physical Therapy

## 2018-04-01 DIAGNOSIS — M25652 Stiffness of left hip, not elsewhere classified: Secondary | ICD-10-CM | POA: Diagnosis not present

## 2018-04-01 DIAGNOSIS — M6281 Muscle weakness (generalized): Secondary | ICD-10-CM

## 2018-04-01 DIAGNOSIS — M25552 Pain in left hip: Secondary | ICD-10-CM | POA: Diagnosis not present

## 2018-04-01 NOTE — Patient Instructions (Signed)
Access Code: [email or web address]  URL: [email or web address]  Date: 04/01/2018  Prepared by: Lovett Calender   Exercises  Supine Hamstring Stretch - 3 reps - 1 sets - 30 sec hold - 1x daily - 7x weekly  Seated Hamstring Stretch - 3 reps - 1 sets - 30 sec hold - 1x daily - 7x weekly  Supine Figure 4 Piriformis Stretch - 3 reps - 1 sets - 30 sec hold - 1x daily - 7x weekly  Supine Bilateral Hip Internal Rotation Stretch - 3 reps - 1 sets - 30 sec hold - 1x daily - 7x weekly  Supine Butterfly Groin Stretch - 3 reps - 1 sets - 30 sec hold - 1x daily - 7x weekly  Supine Transversus Abdominis Bracing - Hands on Stomach - 10 reps - 1 sets - 5 sec hold - 1x daily - 7x weekly  Supine Hip Adduction Isometric with Ball - 5 reps - 1 sets - 5 sec hold - 2x daily - 7x weekly  Supine Transversus Abdominis Bracing with Double Leg Fallout - 10 reps - 1 sets - 1x daily - 7x weekly  Hooklying Isometric Hip Flexion - 10 reps - 1 sets - 5 sec hold - 1x daily - 7x weekly  Standing Repeated Hip Extension with Resistance - 10 reps - 3 sets - 1x daily - 7x weekly  Standing Hip Abduction with Theraband Resistance - 10 reps - 3 sets - 1x daily - 7x weekly  Standing Repeated Hip Flexion with Resistance - 10 reps - 3 sets - 1x daily - 7x weekly  Standing Hip Adduction with Anchored Resistance - 10 reps - 3 sets - 1x daily - 7x weekly  Cat-Camel - 10 reps - 1 sets - 1x daily - 7x weekly  Quadruped Alternating Arm Lift - 10 reps - 3 sets - 1x daily - 7x weekly  Quadruped Hip Extension Kicks - 10 reps - 3 sets - 1x daily - 7x weekly  Quadruped Fire Hydrant - 10 reps - 3 sets - 1x daily - 7x weekly  Child's Pose with Sidebending - 10 reps - 3 sets - 1x daily - 7x weekly  Child's Pose with Sidebending - 3 reps - 1 sets - 20 sec hold - 1x daily - 7x weekly

## 2018-04-01 NOTE — Therapy (Signed)
[facility] [address], [place], [place], [address] Phone: [phone]   Fax:  [phone]  Physical Therapy Treatment  Patient Details  Name: Destiny Frost MRN: [id number] Date of Birth: [birth year] Referring Provider (PT): Eustace Moore, MD   Encounter Date: 04/01/2018  PT End of Session - 04/01/18 1143    Visit Number  6    Date for PT Re-Evaluation  05/03/18    Authorization Type  BCBS    PT Start Time  1143    PT Stop Time  1227    PT Time Calculation (min)  44 min    Activity Tolerance  Patient tolerated treatment well;No increased pain    Behavior During Therapy  WFL for tasks assessed/performed       Past Medical History:  Diagnosis Date  . Fibroid   . Seasonal allergies     Past Surgical History:  Procedure Laterality Date  . MOHS SURGERY  2016   SCALP  . TUBAL LIGATION    . WISDOM TOOTH EXTRACTION      There were no vitals filed for this visit.  Subjective Assessment - 04/01/18 1252    Subjective  I didn't do my exercises over the weekend.  I did the stretches.  No pain and it feels like the things we are doing are helping.    Pertinent History  lumbar surgery 10/18 (cleared from precautions but take it easy)    Patient Stated Goals  regain strength in core, pelvic, for walking and return to normal activities    Currently in Pain?  No/denies                       [facility] Adult PT Treatment/Exercise - 04/01/18 0001      Exercises   Exercises  Lumbar      Lumbar Exercises: Standing   Other Standing Lumbar Exercises  sliders front back and side - 10x each      Lumbar Exercises: Quadruped   Other Quadruped Lumbar Exercises  fire hydrant and donkey kicks      Manual Therapy   Soft tissue mobilization  scar massage to lumbar area to improve mobility and flatten scar; soft tissue work to bilateral gluteal, lumbar thoracic lumbar paraspinals             PT Education  - 04/01/18 1244    Education Details   Access Code: [email or web address]     Person(s) Educated  Patient    Methods  Explanation;Demonstration    Comprehension  Verbalized understanding;Returned demonstration       PT Short Term Goals - 03/22/18 1227      PT SHORT TERM GOAL #1   Title  able to perform kegel correctly    Status  Achieved      PT SHORT TERM GOAL #2   Title  ind with initial HEP    Status  Achieved      PT SHORT TERM GOAL #3   Title  reports only getting up 1x/night at most to void    Status  On-going        PT Long Term Goals - 03/08/18 2110      PT LONG TERM GOAL #1   Title  pt will report she is able to have a 30 minute window before having to get to the bathroom after the initial urge to void    Baseline  10 min    Time  8    Period  Weeks    Status  New    Target Date  05/03/18      PT LONG TERM GOAL #2   Title  Pt will be able to sustain pelvic floor contraction of 2/5 MMT for at least 5 seconds to control urge to void.     Baseline  1/5 for 2 seconds    Time  8    Period  Weeks    Status  New    Target Date  05/03/18      PT LONG TERM GOAL #3   Title  Pt will be able to return to normal walking routine due to improved LE and core strength    Time  8    Period  Weeks    Status  New    Target Date  05/03/18      PT LONG TERM GOAL #4   Title  Pt will be ind with advanced HEP    Time  8    Period  Weeks    Status  New    Target Date  05/03/18      PT LONG TERM GOAL #5   Title  FOTO for back will be < or = to 39% limited    Baseline  starting at 52% limited    Time  8    Period  Weeks    Status  New    Target Date  05/03/18            Plan - 04/01/18 1245    Clinical Impression Statement  Pt was able to travel over the holidays and sit in the car for several hours and overall continues to feel better.  Pt responds well to soft tissue work to lumbar, glutes and scar tissue.  She needs cues to keep pelvis neutral during exercises and fatigues  quickly Lt>RT. Continue core and hip strengthening.    PT Treatment/Interventions  ADLs/Self Care Home Management;Biofeedback;Cryotherapy;Electrical Stimulation;Moist Heat;Therapeutic exercise;Therapeutic activities;Neuromuscular re-education;Patient/family education;Manual techniques;Scar mobilization;Taping;Dry needling    PT Next Visit Plan  lumbar and hip stretches, TrA and core strength, STM and scar massage as needed    PT Home Exercise Plan   Access Code: [email or web address]     Consulted and Agree with Plan of Care  Patient       Patient will benefit from skilled therapeutic intervention in order to improve the following deficits and impairments:  Pain, Abnormal gait, Increased fascial restricitons, Decreased range of motion, Decreased strength, Postural dysfunction  Visit Diagnosis: Muscle weakness (generalized)  Pain in left hip  Stiffness of left hip, not elsewhere classified     Problem List Patient Active Problem List   Diagnosis Date Noted  . Osteoarthritis of spine with radiculopathy, lumbar region 11/20/2017  . Radiculitis 10/23/2017  . Fibroid     Zannie Cove, PT 04/01/2018, 12:53 PM  [facility] [address] [place], [place], [address] Phone: [phone]   Fax:  [phone]  Name: Destiny Frost MRN: [id number] Date of Birth: 10-21-1960

## 2018-04-04 ENCOUNTER — Encounter: Payer: Self-pay | Admitting: Physical Therapy

## 2018-04-04 ENCOUNTER — Ambulatory Visit: Payer: BLUE CROSS/BLUE SHIELD | Admitting: Physical Therapy

## 2018-04-04 DIAGNOSIS — M25552 Pain in left hip: Secondary | ICD-10-CM

## 2018-04-04 DIAGNOSIS — M6281 Muscle weakness (generalized): Secondary | ICD-10-CM | POA: Diagnosis not present

## 2018-04-04 DIAGNOSIS — M25652 Stiffness of left hip, not elsewhere classified: Secondary | ICD-10-CM | POA: Diagnosis not present

## 2018-04-04 NOTE — Therapy (Signed)
[facility] [address], [place], [place], [address] Phone: [phone]   Fax:  [phone]  Physical Therapy Treatment  Patient Details  Name: Destiny Frost MRN: [id number] Date of Birth: 12-19-1960 Referring Provider (PT): Eustace Moore, MD   Encounter Date: 04/04/2018  PT End of Session - 04/04/18 1156    Visit Number  7    Date for PT Re-Evaluation  05/03/18    Authorization Type  BCBS    PT Start Time  1146    PT Stop Time  1228    PT Time Calculation (min)  42 min    Activity Tolerance  Patient tolerated treatment well;No increased pain    Behavior During Therapy  WFL for tasks assessed/performed       Past Medical History:  Diagnosis Date  . Fibroid   . Seasonal allergies     Past Surgical History:  Procedure Laterality Date  . MOHS SURGERY  2016   SCALP  . TUBAL LIGATION    . WISDOM TOOTH EXTRACTION      There were no vitals filed for this visit.  Subjective Assessment - 04/04/18 1153    Subjective  I sat a little longer studying and my tailbone did not like that.  I was sitting for an hour and half.    Pertinent History  lumbar surgery 10/18 (cleared from precautions but take it easy)    Patient Stated Goals  regain strength in core, pelvic, for walking and return to normal activities    Currently in Pain?  No/denies                       [facility] Adult PT Treatment/Exercise - 04/04/18 0001      Exercises   Exercises  Knee/Hip      Lumbar Exercises: Standing   Other Standing Lumbar Exercises  sliders front back and side - 10x each    Other Standing Lumbar Exercises  walking with sports cord - 4 ways (25#) - 5 x each way      Lumbar Exercises: Quadruped   Madcat/Old Horse  10 reps    Opposite Arm/Leg Raise  Right arm/Left leg;Left arm/Right leg;5 reps      Knee/Hip Exercises: Standing   Functional Squat  10 reps    Functional Squat Limitations  squat with red band -  pallor - 10x each way    SLS  standing one leg on rocker board PNF with red tband - 10x each side             PT Education - 04/04/18 1229    Education Details   Access Code: [email or web address]     Person(s) Educated  Patient    Methods  Explanation;Demonstration;Handout;Verbal cues    Comprehension  Verbalized understanding;Returned demonstration       PT Short Term Goals - 03/22/18 1227      PT SHORT TERM GOAL #1   Title  able to perform kegel correctly    Status  Achieved      PT SHORT TERM GOAL #2   Title  ind with initial HEP    Status  Achieved      PT SHORT TERM GOAL #3   Title  reports only getting up 1x/night at most to void    Status  On-going        PT Long Term Goals - 04/04/18 1312      PT LONG TERM GOAL #  3   Title  Pt will be able to return to normal walking routine due to improved LE and core strength    Status  On-going      PT LONG TERM GOAL #4   Title  Pt will be ind with advanced HEP    Status  On-going            Plan - 04/04/18 1229    Clinical Impression Statement  Pt did well with progressions using rocker board and pallof exercises to activate core.  Pt needed some cues for keeping TrA activated.  Pt continues to have some pain in tailbone with sitting > an hour.  She will benefit from skilled PT to progress core strengthening porgram and ensure successful transition to HEP    PT Treatment/Interventions  ADLs/Self Care Home Management;Biofeedback;Cryotherapy;Electrical Stimulation;Moist Heat;Therapeutic exercise;Therapeutic activities;Neuromuscular re-education;Patient/family education;Manual techniques;Scar mobilization;Taping;Dry needling    PT Next Visit Plan  lumbar and hip stretches, TrA and core strength, STM and scar massage as needed    PT Home Exercise Plan   Access Code: [email or web address]     Consulted and Agree with Plan of Care  Patient       Patient will benefit from skilled therapeutic intervention in order to improve the following  deficits and impairments:  Pain, Abnormal gait, Increased fascial restricitons, Decreased range of motion, Decreased strength, Postural dysfunction  Visit Diagnosis: No diagnosis found.     Problem List Patient Active Problem List   Diagnosis Date Noted  . Osteoarthritis of spine with radiculopathy, lumbar region 11/20/2017  . Radiculitis 10/23/2017  . Fibroid     Zannie Cove, PT 04/04/2018, 1:23 PM  [facility] [address] [place], [place], [address] Phone: [phone]   Fax:  [phone]  Name: Destiny Frost MRN: [id number] Date of Birth: 1960-07-02

## 2018-04-04 NOTE — Patient Instructions (Signed)
Access Code: [email or web address]  URL: [email or web address]  Date: 04/04/2018  Prepared by: Lovett Calender   Exercises  Supine Hamstring Stretch - 3 reps - 1 sets - 30 sec hold - 1x daily - 7x weekly  Seated Hamstring Stretch - 3 reps - 1 sets - 30 sec hold - 1x daily - 7x weekly  Supine Figure 4 Piriformis Stretch - 3 reps - 1 sets - 30 sec hold - 1x daily - 7x weekly  Supine Bilateral Hip Internal Rotation Stretch - 3 reps - 1 sets - 30 sec hold - 1x daily - 7x weekly  Supine Butterfly Groin Stretch - 3 reps - 1 sets - 30 sec hold - 1x daily - 7x weekly  Supine Transversus Abdominis Bracing - Hands on Stomach - 10 reps - 1 sets - 5 sec hold - 1x daily - 7x weekly  Supine Hip Adduction Isometric with Ball - 5 reps - 1 sets - 5 sec hold - 2x daily - 7x weekly  Supine Transversus Abdominis Bracing with Double Leg Fallout - 10 reps - 1 sets - 1x daily - 7x weekly  Hooklying Isometric Hip Flexion - 10 reps - 1 sets - 5 sec hold - 1x daily - 7x weekly  Standing Repeated Hip Extension with Resistance - 10 reps - 3 sets - 1x daily - 7x weekly  Standing Hip Abduction with Theraband Resistance - 10 reps - 3 sets - 1x daily - 7x weekly  Standing Repeated Hip Flexion with Resistance - 10 reps - 3 sets - 1x daily - 7x weekly  Standing Hip Adduction with Anchored Resistance - 10 reps - 3 sets - 1x daily - 7x weekly  Cat-Camel - 10 reps - 1 sets - 1x daily - 7x weekly  Quadruped Alternating Arm Lift - 10 reps - 3 sets - 1x daily - 7x weekly  Quadruped Hip Extension Kicks - 10 reps - 3 sets - 1x daily - 7x weekly  Quadruped Fire Hydrant - 10 reps - 3 sets - 1x daily - 7x weekly  Child's Pose with Sidebending - 10 reps - 3 sets - 1x daily - 7x weekly  Child's Pose with Sidebending - 3 reps - 1 sets - 20 sec hold - 1x daily - 7x weekly  Single Leg Stance on Rocker Board - 10 reps - 3 sets - 1x daily - 7x weekly

## 2018-04-08 ENCOUNTER — Ambulatory Visit: Payer: BLUE CROSS/BLUE SHIELD | Admitting: Physical Therapy

## 2018-04-08 ENCOUNTER — Encounter: Payer: Self-pay | Admitting: Physical Therapy

## 2018-04-08 DIAGNOSIS — M6281 Muscle weakness (generalized): Secondary | ICD-10-CM | POA: Diagnosis not present

## 2018-04-08 DIAGNOSIS — M25552 Pain in left hip: Secondary | ICD-10-CM

## 2018-04-08 DIAGNOSIS — M25652 Stiffness of left hip, not elsewhere classified: Secondary | ICD-10-CM | POA: Diagnosis not present

## 2018-04-08 NOTE — Therapy (Signed)
[facility] [address] [place], [place], [address] Phone: [phone]   Fax:  [phone]  Physical Therapy Treatment  Patient Details  Name: Destiny Frost MRN: [id number] Date of Birth: 08/02/1960 Referring Provider (PT): Eustace Moore, MD   Encounter Date: 04/08/2018  PT End of Session - 04/08/18 1323    Visit Number  8    Date for PT Re-Evaluation  05/03/18    Authorization Type  BCBS    PT Start Time  1230    PT Stop Time  1315    PT Time Calculation (min)  45 min    Activity Tolerance  Patient tolerated treatment well;No increased pain    Behavior During Therapy  WFL for tasks assessed/performed       Past Medical History:  Diagnosis Date  . Fibroid   . Seasonal allergies     Past Surgical History:  Procedure Laterality Date  . MOHS SURGERY  2016   SCALP  . TUBAL LIGATION    . WISDOM TOOTH EXTRACTION      There were no vitals filed for this visit.  Subjective Assessment - 04/08/18 1238    Subjective  I still have the some coccyx pain. Dr. Ronnald Ramp said to not lift heavy things for the next 6 weeks, no twisting and turning. Nerve pain is gone. The coccyx pain is soreness when sit ahile.     Limitations  Sitting;Lifting    Patient Stated Goals  regain strength in core, pelvic, for walking and return to normal activities    Currently in Pain?  Yes    Pain Score  2     Pain Location  Coccyx    Pain Orientation  Mid    Pain Descriptors / Indicators  Pressure    Pain Type  Chronic pain    Pain Onset  More than a month ago    Pain Frequency  Intermittent    Aggravating Factors   sitting, driving    Pain Relieving Factors  rest    Multiple Pain Sites  No                    Pelvic Floor Special Questions - 04/08/18 0001    Pelvic Floor Internal Exam  pt identity confirmed and consent was given to perform internal soft tissue assessment    Exam Type  Vaginal;Rectal    Palpation   tenderness located on the left coccygeus and ishciococcygeus and obturator internist    Strength  weak squeeze, no lift   vaginal   Strength # of reps  4    Strength # of seconds  5        [facility] Adult PT Treatment/Exercise - 04/08/18 0001      Exercises   Exercises  Lumbar      Lumbar Exercises: Stretches   Quadruped Mid Back Stretch  3 reps;30 seconds   all 3 ways with hands on bolster   Piriformis Stretch  Right;Left;2 reps   using foam roll     Lumbar Exercises: Quadruped   Madcat/Old Horse  10 reps      Manual Therapy   Manual Therapy  Internal Pelvic Floor    Internal Pelvic Floor  vaginally soft tissue work to the introitus on the sides; Rectally soft tissue work to the coccygeus and ischiochccocygeus             PT Education - 04/08/18 1320    Education Details  Access Code: [email or web address]     Person(s) Educated  Patient    Methods  Explanation;Demonstration;Verbal cues;Handout    Comprehension  Returned demonstration;Verbalized understanding       PT Short Term Goals - 04/08/18 1326      PT SHORT TERM GOAL #3   Title  reports only getting up 1x/night at most to void    Time  4    Period  Weeks    Status  On-going        PT Long Term Goals - 04/04/18 1312      PT LONG TERM GOAL #3   Title  Pt will be able to return to normal walking routine due to improved LE and core strength    Status  On-going      PT LONG TERM GOAL #4   Title  Pt will be ind with advanced HEP    Status  On-going            Plan - 04/08/18 1242    Clinical Impression Statement  Pelvic floor strength vaginally is 2/5 instead of 1/5. Patient is able to feel the pelvic floor contraction. Patient is independent with her HEP. Patient is not having the nerve pain but has soreness and tightness. Patient continues to have weakness in her core but is working on it. Patient will benefit from skilled therapy to progres core strengthening program and ensure successful transition to HEP.      Rehab Potential  Excellent    PT Frequency  2x / week    PT Duration  8 weeks    PT Treatment/Interventions  ADLs/Self Care Home Management;Biofeedback;Cryotherapy;Electrical Stimulation;Moist Heat;Therapeutic exercise;Therapeutic activities;Neuromuscular re-education;Patient/family education;Manual techniques;Scar mobilization;Taping;Dry needling    PT Next Visit Plan  lumbar and hip stretches, TrA and core strength, STM and scar massage as needed; progress pelvic floor exercises; ask about voiding at night    PT Home Exercise Plan   Access Code: [email or web address]     Consulted and Agree with Plan of Care  Patient       Patient will benefit from skilled therapeutic intervention in order to improve the following deficits and impairments:  Pain, Abnormal gait, Increased fascial restricitons, Decreased range of motion, Decreased strength, Postural dysfunction  Visit Diagnosis: Muscle weakness (generalized)  Pain in left hip  Stiffness of left hip, not elsewhere classified     Problem List Patient Active Problem List   Diagnosis Date Noted  . Osteoarthritis of spine with radiculopathy, lumbar region 11/20/2017  . Radiculitis 10/23/2017  . Fibroid     Earlie Counts, PT 04/08/18 1:27 PM   [facility] [address] [place], [place], [address] Phone: [phone]   Fax:  [phone]  Name: Destiny Frost MRN: [id number] Date of Birth: 1961-03-12

## 2018-04-08 NOTE — Patient Instructions (Addendum)
Quick Contraction: Gravity Eliminated (Side-Lying)    Lie on left side, hips and knees slightly bent. Quickly squeeze then fully relax pelvic floor. Perform __1_ sets of _5__. Rest for __1_ seconds between sets. Do _3__ times a day.   Copyright  VHI. All rights reserved.   Slow Contraction: Gravity Eliminated (Side-Lying)    Lie on left side, hips and knees slightly bent. Slowly squeeze pelvic floor for _5__ seconds. Rest for __5_ seconds. Repeat _10__ times. Do _3__ times a day.   Copyright  VHI. All rights reserved.  [facility] [address], [place], [place] [address] Phone # [phone] Fax [phone] Access Code: [facility]  URL: [email or web address]  Date: 04/08/2018  Prepared by: Earlie Counts   Exercises  Supine Hamstring Stretch - 3 reps - 1 sets - 30 sec hold - 1x daily - 7x weekly  Seated Hamstring Stretch - 3 reps - 1 sets - 30 sec hold - 1x daily - 7x weekly  Supine Figure 4 Piriformis Stretch - 3 reps - 1 sets - 30 sec hold - 1x daily - 7x weekly  Supine Bilateral Hip Internal Rotation Stretch - 3 reps - 1 sets - 30 sec hold - 1x daily - 7x weekly  Supine Butterfly Groin Stretch - 3 reps - 1 sets - 30 sec hold - 1x daily - 7x weekly  Supine Transversus Abdominis Bracing - Hands on Stomach - 10 reps - 1 sets - 5 sec hold - 1x daily - 7x weekly  Supine Hip Adduction Isometric with Ball - 5 reps - 1 sets - 5 sec hold - 2x daily - 7x weekly  Supine Transversus Abdominis Bracing with Double Leg Fallout - 10 reps - 1 sets - 1x daily - 7x weekly  Hooklying Isometric Hip Flexion - 10 reps - 1 sets - 5 sec hold - 1x daily - 7x weekly  Standing Repeated Hip Extension with Resistance - 10 reps - 3 sets - 1x daily - 7x weekly  Standing Hip Abduction with Theraband Resistance - 10 reps - 3 sets - 1x daily - 7x weekly  Standing Repeated Hip Flexion with Resistance - 10 reps - 3 sets - 1x daily - 7x weekly  Standing Hip Adduction with  Anchored Resistance - 10 reps - 3 sets - 1x daily - 7x weekly  Cat-Camel - 10 reps - 1 sets - 1x daily - 7x weekly  Quadruped Alternating Arm Lift - 10 reps - 3 sets - 1x daily - 7x weekly  Quadruped Hip Extension Kicks - 10 reps - 3 sets - 1x daily - 7x weekly  Quadruped Fire Hydrant - 10 reps - 3 sets - 1x daily - 7x weekly  Child's Pose with Sidebending - 10 reps - 3 sets - 1x daily - 7x weekly  Child's Pose with Sidebending - 3 reps - 1 sets - 20 sec hold - 1x daily - 7x weekly  Single Leg Stance on Rocker Board - 10 reps - 3 sets - 1x daily - 7x weekly  Piriformis Mobilization on Foam Roll - 10 reps - 1 sets - 1x daily - 7x weekly  Sidelying IT Band Foam Roll Mobilization - 10 reps - 1 sets - 1x daily - 7x weekly  Quadriceps Mobilization with Foam Roll - 10 reps - 1 sets - 1x daily - 7x weekly  Calf Mobilization with Small Ball - 10 reps - 1 sets - 1x daily - 7x weekly  Hamstring Mobilization with Foam  Roll - 10 reps - 1 sets - 1x daily - 7x weekly  Bridge on Foam Roll - Arms on Floor - 10 reps - 1 sets - 1x daily - 7x weekly

## 2018-04-11 ENCOUNTER — Encounter: Payer: Self-pay | Admitting: Physical Therapy

## 2018-04-11 ENCOUNTER — Ambulatory Visit: Payer: BLUE CROSS/BLUE SHIELD | Admitting: Physical Therapy

## 2018-04-11 DIAGNOSIS — M6281 Muscle weakness (generalized): Secondary | ICD-10-CM | POA: Diagnosis not present

## 2018-04-11 DIAGNOSIS — M25652 Stiffness of left hip, not elsewhere classified: Secondary | ICD-10-CM | POA: Diagnosis not present

## 2018-04-11 DIAGNOSIS — M25552 Pain in left hip: Secondary | ICD-10-CM

## 2018-04-11 NOTE — Therapy (Signed)
[facility] [address], [place], [place], [address] Phone: [phone]   Fax:  [phone]  Physical Therapy Treatment  Patient Details  Name: Destiny Frost MRN: [id number] Date of Birth: 1960/09/19 Referring Provider (PT): Eustace Moore, MD   Encounter Date: 04/11/2018  PT End of Session - 04/11/18 1156    Visit Number  9    Date for PT Re-Evaluation  05/03/18    Authorization Type  BCBS    PT Start Time  5732    PT Stop Time  1225    PT Time Calculation (min)  40 min    Activity Tolerance  Patient tolerated treatment well;No increased pain    Behavior During Therapy  WFL for tasks assessed/performed       Past Medical History:  Diagnosis Date  . Fibroid   . Seasonal allergies     Past Surgical History:  Procedure Laterality Date  . MOHS SURGERY  2016   SCALP  . TUBAL LIGATION    . WISDOM TOOTH EXTRACTION      There were no vitals filed for this visit.  Subjective Assessment - 04/11/18 1155    Subjective  I felt good after last visit. My tail bone felt better the next morning. I need the softer roller. I can feel the pelvic floor muscles contract now.     Pertinent History  lumbar surgery 10/18 (cleared from precautions but take it easy)    Limitations  Sitting;Lifting    Patient Stated Goals  regain strength in core, pelvic, for walking and return to normal activities    Currently in Pain?  No/denies    Multiple Pain Sites  No                       [facility] Adult PT Treatment/Exercise - 04/11/18 0001      Lumbar Exercises: Aerobic   Nustep  seat#10,arm#11,level5; 8 min      Lumbar Exercises: Standing   Other Standing Lumbar Exercises  sliders front, back and side - 10x each   VC to contract the pelvic floor   Other Standing Lumbar Exercises  walking with sports cord - 4 ways (25#) - 5 x each way      Lumbar Exercises: Seated   Other Seated Lumbar Exercises  alternate hip  flexion with pelvic floor contraction 10x; alternate shoulder flexion with pelvic floor contraction; alternate shoulder and hip flexion with pelvic floor contraction      Lumbar Exercises: Quadruped   Opposite Arm/Leg Raise  Right arm/Left leg;Left arm/Right leg;5 reps   keep a measuring stick on back to keep alignment              PT Short Term Goals - 04/11/18 1159      PT SHORT TERM GOAL #3   Title  reports only getting up 1x/night at most to void    Time  4    Period  Weeks    Status  Achieved        PT Long Term Goals - 04/04/18 1312      PT LONG TERM GOAL #3   Title  Pt will be able to return to normal walking routine due to improved LE and core strength    Status  On-going      PT LONG TERM GOAL #4   Title  Pt will be ind with advanced HEP    Status  On-going  Plan - 04/11/18 1157    Clinical Impression Statement  Patient had no coccyx pain after therapy. Patient only wakes up 0-1 time per night due to being hot at night. Patient is able to feel the pelvic floor contract now. Patient will benefit from skilled therapy to progress core strengthening program and ensure successful transition to HEP.     Rehab Potential  Excellent    PT Frequency  2x / week    PT Duration  8 weeks    PT Treatment/Interventions  ADLs/Self Care Home Management;Biofeedback;Cryotherapy;Electrical Stimulation;Moist Heat;Therapeutic exercise;Therapeutic activities;Neuromuscular re-education;Patient/family education;Manual techniques;Scar mobilization;Taping;Dry needling    PT Next Visit Plan  lumbar and hip stretches, TrA and core strength, STM and scar massage as needed; progress pelvic floor exercises    PT Home Exercise Plan   Access Code: [email or web address]     Consulted and Agree with Plan of Care  Patient       Patient will benefit from skilled therapeutic intervention in order to improve the following deficits and impairments:  Pain, Abnormal gait, Increased fascial  restricitons, Decreased range of motion, Decreased strength, Postural dysfunction  Visit Diagnosis: Muscle weakness (generalized)  Pain in left hip  Stiffness of left hip, not elsewhere classified     Problem List Patient Active Problem List   Diagnosis Date Noted  . Osteoarthritis of spine with radiculopathy, lumbar region 11/20/2017  . Radiculitis 10/23/2017  . Fibroid     Earlie Counts, PT 04/11/18 12:27 PM   [facility] [address], [place], [place], [address] Phone: [phone]   Fax:  [phone]  Name: Destiny Frost MRN: [id number] Date of Birth: 04-11-1961

## 2018-04-15 ENCOUNTER — Ambulatory Visit: Payer: BLUE CROSS/BLUE SHIELD | Admitting: Physical Therapy

## 2018-04-15 ENCOUNTER — Encounter: Payer: Self-pay | Admitting: Physical Therapy

## 2018-04-15 DIAGNOSIS — M25652 Stiffness of left hip, not elsewhere classified: Secondary | ICD-10-CM | POA: Diagnosis not present

## 2018-04-15 DIAGNOSIS — M25552 Pain in left hip: Secondary | ICD-10-CM

## 2018-04-15 DIAGNOSIS — M6281 Muscle weakness (generalized): Secondary | ICD-10-CM

## 2018-04-15 NOTE — Therapy (Signed)
[facility] [address], [place], [place], [address] Phone: [phone]   Fax:  [phone]  Physical Therapy Treatment  Patient Details  Name: Destiny Frost MRN: [id number] Date of Birth: [birth year] Referring Provider (PT): Eustace Moore, MD   Encounter Date: 04/15/2018  PT End of Session - 04/15/18 1151    Visit Number  10    Date for PT Re-Evaluation  05/03/18    Authorization Type  BCBS    PT Start Time  9798    PT Stop Time  1225    PT Time Calculation (min)  40 min    Activity Tolerance  Patient tolerated treatment well;No increased pain    Behavior During Therapy  WFL for tasks assessed/performed       Past Medical History:  Diagnosis Date  . Fibroid   . Seasonal allergies     Past Surgical History:  Procedure Laterality Date  . MOHS SURGERY  2016   SCALP  . TUBAL LIGATION    . WISDOM TOOTH EXTRACTION      There were no vitals filed for this visit.  Subjective Assessment - 04/15/18 1149    Subjective  I have slept through the night the past 2 nights. I am able to put my  shoes and socks on by myself. I walked this morning. I felt good after last vist. the more I do the pelvic floor muscles pull up and I feel my tailbone but no pain. I am able to walk for 1 hour but not at my normal pace. I can stretch more.     Pertinent History  lumbar surgery 10/18 (cleared from precautions but take it easy)    Limitations  Sitting;Lifting    Patient Stated Goals  regain strength in core, pelvic, for walking and return to normal activities    Currently in Pain?  No/denies    Multiple Pain Sites  No                       [facility] Adult PT Treatment/Exercise - 04/15/18 0001      Lumbar Exercises: Aerobic   Nustep  seat#10,arm#11,level5; 8 min      Lumbar Exercises: Standing   Other Standing Lumbar Exercises  sliders front, back and side - 15x each    Other Standing Lumbar Exercises  walking  with sports cord - 4 ways (25#) - 5 x each way      Lumbar Exercises: Seated   Other Seated Lumbar Exercises  sit on blue physioball-3 way shoulder raise 1# 10x2 with abdominal bracing, alternate shoulder and hip flexion holding 1# weight      Knee/Hip Exercises: Standing   Other Standing Knee Exercises  stand on flat BOSU ball mini squat 15 times    Other Standing Knee Exercises  side step with red band working the pelvic floor               PT Short Term Goals - 04/15/18 1152      PT SHORT TERM GOAL #1   Title  able to perform kegel correctly    Time  2    Period  Weeks    Status  Achieved      PT SHORT TERM GOAL #2   Title  ind with initial HEP    Time  2    Period  Weeks    Status  Achieved      PT SHORT  TERM GOAL #3   Title  reports only getting up 1x/night at most to void    Time  4    Period  Weeks    Status  Achieved        PT Long Term Goals - 04/15/18 1153      PT LONG TERM GOAL #1   Title  pt will report she is able to have a 30 minute window before having to get to the bathroom after the initial urge to void    Baseline  10 min    Time  8    Period  Weeks    Status  On-going      PT LONG TERM GOAL #2   Title  Pt will be able to sustain pelvic floor contraction of 2/5 MMT for at least 5 seconds to control urge to void.     Time  8    Period  Weeks    Status  On-going      PT LONG TERM GOAL #3   Title  Pt will be able to return to normal walking routine due to improved LE and core strength    Time  8    Period  Weeks    Status  Achieved      PT LONG TERM GOAL #4   Title  Pt will be ind with advanced HEP    Time  8    Period  Weeks    Status  On-going      PT LONG TERM GOAL #5   Title  FOTO for back will be < or = to 39% limited    Baseline  starting at 52% limited    Time  8    Period  Weeks    Status  On-going            Plan - 04/15/18 1151    Clinical Impression Statement  Patient is now able to sleep through the night.  Patient is now walking 1 hour but not at her normal pace. Patient will benefit from skilled therapy to progress core strengthening program and ensure successful transition to HEP.     Rehab Potential  Excellent    PT Frequency  2x / week    PT Duration  8 weeks    PT Treatment/Interventions  ADLs/Self Care Home Management;Biofeedback;Cryotherapy;Electrical Stimulation;Moist Heat;Therapeutic exercise;Therapeutic activities;Neuromuscular re-education;Patient/family education;Manual techniques;Scar mobilization;Taping;Dry needling    PT Next Visit Plan  update HEP from last visit    PT Home Exercise Plan   Access Code: [email or web address]     Consulted and Agree with Plan of Care  Patient       Patient will benefit from skilled therapeutic intervention in order to improve the following deficits and impairments:  Pain, Abnormal gait, Increased fascial restricitons, Decreased range of motion, Decreased strength, Postural dysfunction  Visit Diagnosis: Muscle weakness (generalized)  Pain in left hip  Stiffness of left hip, not elsewhere classified     Problem List Patient Active Problem List   Diagnosis Date Noted  . Osteoarthritis of spine with radiculopathy, lumbar region 11/20/2017  . Radiculitis 10/23/2017  . Fibroid     Earlie Counts, PT 04/15/18 12:29 PM ' [facility] [address], [place], [place], [address] Phone: [phone]   Fax:  [phone]  Name: LEDORA DELKER MRN: [id number] Date of Birth: [birth year]

## 2018-04-18 ENCOUNTER — Encounter: Payer: Self-pay | Admitting: Physical Therapy

## 2018-04-18 ENCOUNTER — Ambulatory Visit: Payer: BLUE CROSS/BLUE SHIELD | Admitting: Physical Therapy

## 2018-04-18 DIAGNOSIS — M6281 Muscle weakness (generalized): Secondary | ICD-10-CM | POA: Diagnosis not present

## 2018-04-18 DIAGNOSIS — M25652 Stiffness of left hip, not elsewhere classified: Secondary | ICD-10-CM | POA: Diagnosis not present

## 2018-04-18 DIAGNOSIS — M25552 Pain in left hip: Secondary | ICD-10-CM | POA: Diagnosis not present

## 2018-04-18 NOTE — Patient Instructions (Signed)
Access Code: [email or web address]  URL: [email or web address]  Date: 04/18/2018  Prepared by: Earlie Counts   Exercises  Supine Hamstring Stretch - 3 reps - 1 sets - 30 sec hold - 1x daily - 7x weekly  Seated Hamstring Stretch - 3 reps - 1 sets - 30 sec hold - 1x daily - 7x weekly  Supine Figure 4 Piriformis Stretch - 3 reps - 1 sets - 30 sec hold - 1x daily - 7x weekly  Supine Bilateral Hip Internal Rotation Stretch - 3 reps - 1 sets - 30 sec hold - 1x daily - 7x weekly  Supine Butterfly Groin Stretch - 3 reps - 1 sets - 30 sec hold - 1x daily - 7x weekly  Supine Transversus Abdominis Bracing - Hands on Stomach - 10 reps - 1 sets - 5 sec hold - 1x daily - 7x weekly  Supine Transversus Abdominis Bracing with Double Leg Fallout - 10 reps - 1 sets - 1x daily - 7x weekly  Standing Repeated Hip Flexion with Resistance - 10 reps - 3 sets - 1x daily - 7x weekly  Standing Hip Adduction with Anchored Resistance - 10 reps - 3 sets - 1x daily - 7x weekly  Cat-Camel - 10 reps - 1 sets - 1x daily - 7x weekly  Quadruped Alternating Arm Lift - 10 reps - 3 sets - 1x daily - 7x weekly  Quadruped Hip Extension Kicks - 10 reps - 3 sets - 1x daily - 7x weekly  Quadruped Fire Hydrant - 10 reps - 3 sets - 1x daily - 7x weekly  Child's Pose with Sidebending - 3 reps - 1 sets - 20 sec hold - 1x daily - 7x weekly  Single Leg Stance on Rocker Board - 10 reps - 3 sets - 1x daily - 7x weekly  Piriformis Mobilization on Foam Roll - 10 reps - 1 sets - 1x daily - 7x weekly  Sidelying IT Band Foam Roll Mobilization - 10 reps - 1 sets - 1x daily - 7x weekly  Quadriceps Mobilization with Foam Roll - 10 reps - 1 sets - 1x daily - 7x weekly  Calf Mobilization with Small Ball - 10 reps - 1 sets - 1x daily - 7x weekly  Hamstring Mobilization with Foam Roll - 10 reps - 1 sets - 1x daily - 7x weekly  Bridge on Foam Roll - Arms on Floor - 10 reps - 1 sets - 1x daily - 7x weekly  Standing Hip Abduction on Slider - 15 reps - 1  sets - 1x daily - 7x weekly  Sidestepping with Pelvic Floor Contraction and Resistance - 10 reps - 2 sets - 1x daily - 7x weekly  Dead Bug - 10 reps - 2 sets - 1x daily - 7x weekly  Shoulder Abduction on Swiss Ball with Dumbbells - 10 reps - 2 sets - 1x daily - 7x weekly  Alternating Shoulder Flexion Seated on Swiss Ball - 10 reps - 2 sets - 1x daily - 7x weekly  Seated March with Opposite Arm Flexion on Swiss Ball - 10 reps - 1 sets - 1x daily - 7x weekly  Bridge with Hip Abduction and Resistance - Ground Touches - 15 reps - 1 sets - 1x daily - 7x weekly  [facility] [address], [place], [place] [address] Phone # [phone] Fax [phone]

## 2018-04-18 NOTE — Therapy (Signed)
[facility] [address] [place], [place], [address] Phone: [phone]   Fax:  [phone]  Physical Therapy Treatment  Patient Details  Name: Destiny Frost MRN: [id number] Date of Birth: 1960/12/14 Referring Provider (PT): Eustace Moore, MD   Encounter Date: 04/18/2018  PT End of Session - 04/18/18 1244    Visit Number  11    Date for PT Re-Evaluation  05/03/18    Authorization Type  BCBS    PT Start Time  1230    PT Stop Time  1310    PT Time Calculation (min)  40 min    Activity Tolerance  Patient tolerated treatment well;No increased pain    Behavior During Therapy  WFL for tasks assessed/performed       Past Medical History:  Diagnosis Date  . Fibroid   . Seasonal allergies     Past Surgical History:  Procedure Laterality Date  . MOHS SURGERY  2016   SCALP  . TUBAL LIGATION    . WISDOM TOOTH EXTRACTION      There were no vitals filed for this visit.  Subjective Assessment - 04/18/18 1241    Subjective  I felt good after last visit.     Pertinent History  lumbar surgery 10/18 (cleared from precautions but take it easy)    Limitations  Sitting;Lifting    Patient Stated Goals  regain strength in core, pelvic, for walking and return to normal activities    Currently in Pain?  No/denies                       [facility] Adult PT Treatment/Exercise - 04/18/18 0001      Lumbar Exercises: Standing   Other Standing Lumbar Exercises  sliders front, back and side holding green plyoball - 15x each      Lumbar Exercises: Seated   Other Seated Lumbar Exercises  sit on blue physioball-3 way shoulder raise 1# 10x2 with abdominal bracing, alternate shoulder and hip flexion holding 1# weight      Lumbar Exercises: Supine   Dead Bug  15 reps;1 second   stabilization of spine   Bridge  10 reps    Bridge with clamshell  15 reps;1 second             PT Education - 04/18/18 1318    Education Details  Access Code: [employer]     Person(s) Educated  Patient    Methods  Explanation;Demonstration;Verbal cues;Handout    Comprehension  Returned demonstration;Verbalized understanding       PT Short Term Goals - 04/15/18 1152      PT SHORT TERM GOAL #1   Title  able to perform kegel correctly    Time  2    Period  Weeks    Status  Achieved      PT SHORT TERM GOAL #2   Title  ind with initial HEP    Time  2    Period  Weeks    Status  Achieved      PT SHORT TERM GOAL #3   Title  reports only getting up 1x/night at most to void    Time  4    Period  Weeks    Status  Achieved        PT Long Term Goals - 04/15/18 1153      PT LONG TERM GOAL #1   Title  pt will report she is able  to have a 30 minute window before having to get to the bathroom after the initial urge to void    Baseline  10 min    Time  8    Period  Weeks    Status  On-going      PT LONG TERM GOAL #2   Title  Pt will be able to sustain pelvic floor contraction of 2/5 MMT for at least 5 seconds to control urge to void.     Time  8    Period  Weeks    Status  On-going      PT LONG TERM GOAL #3   Title  Pt will be able to return to normal walking routine due to improved LE and core strength    Time  8    Period  Weeks    Status  Achieved      PT LONG TERM GOAL #4   Title  Pt will be ind with advanced HEP    Time  8    Period  Weeks    Status  On-going      PT LONG TERM GOAL #5   Title  FOTO for back will be < or = to 39% limited    Baseline  starting at 52% limited    Time  8    Period  Weeks    Status  On-going            Plan - 04/18/18 1244    Clinical Impression Statement  Patient is not waking up in the middle of the nigh for pain. Patient is exercising reguarly. Patient is able to stabilize her spine with her exercises. Patient has increased endurance. Patient will benefit from skilled therapy to progress core strengththing.     Rehab Potential  Excellent    PT  Frequency  2x / week    PT Duration  8 weeks    PT Treatment/Interventions  ADLs/Self Care Home Management;Biofeedback;Cryotherapy;Electrical Stimulation;Moist Heat;Therapeutic exercise;Therapeutic activities;Neuromuscular re-education;Patient/family education;Manual techniques;Scar mobilization;Taping;Dry needling    PT Next Visit Plan  core stabilization in sitting, standing and supine    PT Home Exercise Plan   Access Code: [email or web address]     Consulted and Agree with Plan of Care  Patient       Patient will benefit from skilled therapeutic intervention in order to improve the following deficits and impairments:  Pain, Abnormal gait, Increased fascial restricitons, Decreased range of motion, Decreased strength, Postural dysfunction  Visit Diagnosis: Muscle weakness (generalized)  Pain in left hip  Stiffness of left hip, not elsewhere classified     Problem List Patient Active Problem List   Diagnosis Date Noted  . Osteoarthritis of spine with radiculopathy, lumbar region 11/20/2017  . Radiculitis 10/23/2017  . Fibroid     Earlie Counts, PT 04/18/18 2:16 PM   [facility] [address] [place], [place], [address] Phone: [phone]   Fax:  [phone]  Name: TERITA HEJL MRN: [id number] Date of Birth: [birth year]

## 2018-04-22 ENCOUNTER — Ambulatory Visit: Payer: BLUE CROSS/BLUE SHIELD | Admitting: Physical Therapy

## 2018-04-22 ENCOUNTER — Encounter: Payer: Self-pay | Admitting: Physical Therapy

## 2018-04-22 DIAGNOSIS — M25652 Stiffness of left hip, not elsewhere classified: Secondary | ICD-10-CM | POA: Diagnosis not present

## 2018-04-22 DIAGNOSIS — M6281 Muscle weakness (generalized): Secondary | ICD-10-CM

## 2018-04-22 DIAGNOSIS — M25552 Pain in left hip: Secondary | ICD-10-CM

## 2018-04-22 NOTE — Therapy (Signed)
[facility] [address] [place], [place], [address] Phone: [phone]   Fax:  [phone]  Physical Therapy Treatment  Patient Details  Name: Destiny Frost MRN: [id number] Date of Birth: 16-Jun-1960 Referring Provider (PT): Eustace Moore, MD   Encounter Date: 04/22/2018  PT End of Session - 04/22/18 1225    Visit Number  12    Date for PT Re-Evaluation  05/03/18    Authorization Type  BCBS    PT Start Time  6144    PT Stop Time  1225    PT Time Calculation (min)  40 min    Activity Tolerance  Patient tolerated treatment well;No increased pain    Behavior During Therapy  WFL for tasks assessed/performed       Past Medical History:  Diagnosis Date  . Fibroid   . Seasonal allergies     Past Surgical History:  Procedure Laterality Date  . MOHS SURGERY  2016   SCALP  . TUBAL LIGATION    . WISDOM TOOTH EXTRACTION      There were no vitals filed for this visit.  Subjective Assessment - 04/22/18 1152    Subjective  Next week will be my last visit. I am working on my pelvic floor muscles.     Pertinent History  lumbar surgery 10/18 (cleared from precautions but take it easy)    Limitations  Sitting;Lifting    Patient Stated Goals  regain strength in core, pelvic, for walking and return to normal activities    Currently in Pain?  No/denies                       [facility] Adult PT Treatment/Exercise - 04/22/18 0001      Lumbar Exercises: Aerobic   Elliptical  4 min level 1 cross ramp level 1      Lumbar Exercises: Standing   Other Standing Lumbar Exercises  stand on flat side of BOSU ball with keeping balance with eyes open and closed, mini squats on flat side of BOSU      Lumbar Exercises: Seated   Other Seated Lumbar Exercises  sit on blue physioball-3 way shoulder raise 2# 10x2 with abdominal bracing, alternate shoulder and hip flexion holding 2# weight, overhead press, heel slides  forward and slide out to the side       Lumbar Exercises: Supine   Dead Bug  15 reps;1 second   stabilization of spine   Dead Bug Limitations  2# weight in hands    Bridge with March  20 reps;1 second      Lumbar Exercises: Quadruped   Other Quadruped Lumbar Exercises  hands on sliders and reach forward and sideways, half circles, half moon with feet    Other Quadruped Lumbar Exercises  tall knees rolling ball forward with abdominal bracing 20 times               PT Short Term Goals - 04/15/18 1152      PT SHORT TERM GOAL #1   Title  able to perform kegel correctly    Time  2    Period  Weeks    Status  Achieved      PT SHORT TERM GOAL #2   Title  ind with initial HEP    Time  2    Period  Weeks    Status  Achieved      PT SHORT TERM GOAL #3  Title  reports only getting up 1x/night at most to void    Time  4    Period  Weeks    Status  Achieved        PT Long Term Goals - 04/15/18 1153      PT LONG TERM GOAL #1   Title  pt will report she is able to have a 30 minute window before having to get to the bathroom after the initial urge to void    Baseline  10 min    Time  8    Period  Weeks    Status  On-going      PT LONG TERM GOAL #2   Title  Pt will be able to sustain pelvic floor contraction of 2/5 MMT for at least 5 seconds to control urge to void.     Time  8    Period  Weeks    Status  On-going      PT LONG TERM GOAL #3   Title  Pt will be able to return to normal walking routine due to improved LE and core strength    Time  8    Period  Weeks    Status  Achieved      PT LONG TERM GOAL #4   Title  Pt will be ind with advanced HEP    Time  8    Period  Weeks    Status  On-going      PT LONG TERM GOAL #5   Title  FOTO for back will be < or = to 39% limited    Baseline  starting at 52% limited    Time  8    Period  Weeks    Status  On-going            Plan - 04/22/18 1154    Clinical Impression Statement  Patient continues to  learn new exercises for trunk stabilization with different positions and arm/leg movements. Patient is not able to lift her hips as much with bridges. Patient has difficulty with foot slides while sitting on the physioball and needed c.g. Patient will benefit from skilled therapy to progress her core strength.     Rehab Potential  Excellent    PT Frequency  2x / week    PT Duration  8 weeks    PT Treatment/Interventions  ADLs/Self Care Home Management;Biofeedback;Cryotherapy;Electrical Stimulation;Moist Heat;Therapeutic exercise;Therapeutic activities;Neuromuscular re-education;Patient/family education;Manual techniques;Scar mobilization;Taping;Dry needling    PT Next Visit Plan  core stabilization in sitting, standing and supine; update HEP from last session    PT Home Exercise Plan   Access Code: [email or web address]     Consulted and Agree with Plan of Care  Patient       Patient will benefit from skilled therapeutic intervention in order to improve the following deficits and impairments:  Pain, Abnormal gait, Increased fascial restricitons, Decreased range of motion, Decreased strength, Postural dysfunction  Visit Diagnosis: Muscle weakness (generalized)  Pain in left hip  Stiffness of left hip, not elsewhere classified     Problem List Patient Active Problem List   Diagnosis Date Noted  . Osteoarthritis of spine with radiculopathy, lumbar region 11/20/2017  . Radiculitis 10/23/2017  . Fibroid     Earlie Counts, PT 04/22/18 12:28 PM   [facility] [address], [place], [place], [address] Phone: [phone]   Fax:  [phone]  Name: WILBERT SCHOUTEN MRN: [id number] Date of Birth: 1961/03/10

## 2018-04-25 ENCOUNTER — Encounter: Payer: Self-pay | Admitting: Physical Therapy

## 2018-04-25 ENCOUNTER — Ambulatory Visit: Payer: BLUE CROSS/BLUE SHIELD | Admitting: Physical Therapy

## 2018-04-25 DIAGNOSIS — M6281 Muscle weakness (generalized): Secondary | ICD-10-CM

## 2018-04-25 DIAGNOSIS — M25552 Pain in left hip: Secondary | ICD-10-CM | POA: Diagnosis not present

## 2018-04-25 DIAGNOSIS — M25652 Stiffness of left hip, not elsewhere classified: Secondary | ICD-10-CM | POA: Diagnosis not present

## 2018-04-25 NOTE — Patient Instructions (Signed)
Access Code: [email or web address]  URL: [email or web address]  Date: 04/25/2018  Prepared by: Earlie Counts   Exercises  Supine Hamstring Stretch - 3 reps - 1 sets - 30 sec hold - 1x daily - 7x weekly  Seated Hamstring Stretch - 3 reps - 1 sets - 30 sec hold - 1x daily - 7x weekly  Supine Figure 4 Piriformis Stretch - 3 reps - 1 sets - 30 sec hold - 1x daily - 7x weekly  Supine Bilateral Hip Internal Rotation Stretch - 3 reps - 1 sets - 30 sec hold - 1x daily - 7x weekly  Supine Butterfly Groin Stretch - 3 reps - 1 sets - 30 sec hold - 1x daily - 7x weekly  Supine Transversus Abdominis Bracing - Hands on Stomach - 10 reps - 1 sets - 5 sec hold - 1x daily - 7x weekly  Supine Transversus Abdominis Bracing with Double Leg Fallout - 10 reps - 1 sets - 1x daily - 7x weekly  Standing Repeated Hip Flexion with Resistance - 10 reps - 3 sets - 1x daily - 7x weekly  Standing Hip Adduction with Anchored Resistance - 10 reps - 3 sets - 1x daily - 7x weekly  Cat-Camel - 10 reps - 1 sets - 1x daily - 7x weekly  Quadruped Alternating Arm Lift - 10 reps - 3 sets - 1x daily - 7x weekly  Quadruped Hip Extension Kicks - 10 reps - 3 sets - 1x daily - 7x weekly  Quadruped Fire Hydrant - 10 reps - 3 sets - 1x daily - 7x weekly  Child's Pose with Sidebending - 3 reps - 1 sets - 20 sec hold - 1x daily - 7x weekly  Single Leg Stance on Rocker Board - 10 reps - 3 sets - 1x daily - 7x weekly  Piriformis Mobilization on Foam Roll - 10 reps - 1 sets - 1x daily - 7x weekly  Sidelying IT Band Foam Roll Mobilization - 10 reps - 1 sets - 1x daily - 7x weekly  Quadriceps Mobilization with Foam Roll - 10 reps - 1 sets - 1x daily - 7x weekly  Calf Mobilization with Small Ball - 10 reps - 1 sets - 1x daily - 7x weekly  Hamstring Mobilization with Foam Roll - 10 reps - 1 sets - 1x daily - 7x weekly  Bridge on Foam Roll - Arms on Floor - 10 reps - 1 sets - 1x daily - 7x weekly  Standing Hip Abduction on Slider - 15 reps - 1  sets - 1x daily - 7x weekly  Sidestepping with Pelvic Floor Contraction and Resistance - 10 reps - 2 sets - 1x daily - 7x weekly  Dead Bug - 10 reps - 2 sets - 1x daily - 7x weekly  Shoulder Abduction on Swiss Ball with Dumbbells - 10 reps - 2 sets - 1x daily - 7x weekly  Alternating Shoulder Flexion Seated on Swiss Ball - 10 reps - 2 sets - 1x daily - 7x weekly  Seated March with Opposite Arm Flexion on Swiss Ball - 10 reps - 1 sets - 1x daily - 7x weekly  Bridge with Hip Abduction and Resistance - Ground Touches - 15 reps - 1 sets - 1x daily - 7x weekly  Shoulder Press on [employer] - 10 reps - 1 sets - 1x daily - 7x weekly  Seated Heel Slide - 10 reps - 1 sets - 1x daily - 7x weekly  Shoulder Bridge Prep with  Marching - 10 reps - 1 sets - 1x daily - 7x weekly  Butterfly Bridge - 10 reps - 1 sets - 1x daily - 7x weekly  Quadruped Alternating Arm Lift - 10 reps - 1 sets - 1x daily - 7x weekly  Tall Kneeling Posterior Pelvic Tilt - 10 reps - 1 sets - 1x daily - 7x weekly  [facility] [address] [place], [place] [address] Phone # [phone] Fax [phone]

## 2018-04-25 NOTE — Therapy (Signed)
[facility] [address] [place], [place], [address] Phone: [phone]   Fax:  [phone]  Physical Therapy Treatment  Patient Details  Name: Destiny Frost MRN: [id number] Date of Birth: 1961-02-11 Referring Provider (PT): Eustace Moore, MD   Encounter Date: 04/25/2018  PT End of Session - 04/25/18 1228    Visit Number  13    Date for PT Re-Evaluation  05/03/18    Authorization Type  BCBS    PT Start Time  2671    PT Stop Time  1228    PT Time Calculation (min)  43 min    Activity Tolerance  Patient tolerated treatment well;No increased pain    Behavior During Therapy  WFL for tasks assessed/performed       Past Medical History:  Diagnosis Date  . Fibroid   . Seasonal allergies     Past Surgical History:  Procedure Laterality Date  . MOHS SURGERY  2016   SCALP  . TUBAL LIGATION    . WISDOM TOOTH EXTRACTION      There were no vitals filed for this visit.  Subjective Assessment - 04/25/18 1151    Subjective  I did not get to exercise due to the holidays.     Pertinent History  lumbar surgery 10/18 (cleared from precautions but take it easy)    Limitations  Sitting;Lifting    Patient Stated Goals  regain strength in core, pelvic, for walking and return to normal activities    Currently in Pain?  No/denies    Multiple Pain Sites  No                       [facility] Adult PT Treatment/Exercise - 04/25/18 0001      Lumbar Exercises: Aerobic   Elliptical  5 min level 1 cross ramp level 1      Lumbar Exercises: Standing   Other Standing Lumbar Exercises  stand on flat side of BOSU ball with keeping balance with eyes open and closed, mini squats on flat side of BOSU      Lumbar Exercises: Seated   Other Seated Lumbar Exercises  sit on blue physioball-3 way shoulder raise 2# 10x2 with abdominal bracing, alternate shoulder and hip flexion holding 2# weight, overhead press, heel slides  forward and slide out to the side       Lumbar Exercises: Supine   Dead Bug  15 reps;1 second   stabilization of spine   Dead Bug Limitations  2# weight in hands    Bridge with clamshell  15 reps;1 second    Bridge with March  20 reps;1 second      Lumbar Exercises: Quadruped   Other Quadruped Lumbar Exercises  hands on sliders and reach forward and sideways, half circles, half moon with feet    Other Quadruped Lumbar Exercises  tall knees rolling ball forward with abdominal bracing 20 times             PT Education - 04/25/18 1225    Education Details  Access Code: [email or web address]) Educated  Patient    Methods  Explanation;Demonstration;Verbal cues;Handout    Comprehension  Returned demonstration;Verbalized understanding       PT Short Term Goals - 04/15/18 1152      PT SHORT TERM GOAL #1   Title  able to perform kegel correctly    Time  2    Period  Weeks  Status  Achieved      PT SHORT TERM GOAL #2   Title  ind with initial HEP    Time  2    Period  Weeks    Status  Achieved      PT SHORT TERM GOAL #3   Title  reports only getting up 1x/night at most to void    Time  4    Period  Weeks    Status  Achieved        PT Long Term Goals - 04/15/18 1153      PT LONG TERM GOAL #1   Title  pt will report she is able to have a 30 minute window before having to get to the bathroom after the initial urge to void    Baseline  10 min    Time  8    Period  Weeks    Status  On-going      PT LONG TERM GOAL #2   Title  Pt will be able to sustain pelvic floor contraction of 2/5 MMT for at least 5 seconds to control urge to void.     Time  8    Period  Weeks    Status  On-going      PT LONG TERM GOAL #3   Title  Pt will be able to return to normal walking routine due to improved LE and core strength    Time  8    Period  Weeks    Status  Achieved      PT LONG TERM GOAL #4   Title  Pt will be ind with advanced HEP    Time  8    Period  Weeks    Status   On-going      PT LONG TERM GOAL #5   Title  FOTO for back will be < or = to 39% limited    Baseline  starting at 52% limited    Time  8    Period  Weeks    Status  On-going            Plan - 04/25/18 1154    Clinical Impression Statement  Patient HEP was updated today and she was able to perform with minimal to no verbal cues. Patient understands how to advance her program. Patient is able to lay on her side with greater ease. Patient is not having back pain. Patient will benefit from 1 more session to review program.     Rehab Potential  Excellent    PT Frequency  2x / week    PT Duration  8 weeks    PT Treatment/Interventions  ADLs/Self Care Home Management;Biofeedback;Cryotherapy;Electrical Stimulation;Moist Heat;Therapeutic exercise;Therapeutic activities;Neuromuscular re-education;Patient/family education;Manual techniques;Scar mobilization;Taping;Dry needling    PT Next Visit Plan  Discharge to HEP    PT Home Exercise Plan   Access Code: [email or web address]     Consulted and Agree with Plan of Care  Patient       Patient will benefit from skilled therapeutic intervention in order to improve the following deficits and impairments:  Pain, Abnormal gait, Increased fascial restricitons, Decreased range of motion, Decreased strength, Postural dysfunction  Visit Diagnosis: Muscle weakness (generalized)  Pain in left hip  Stiffness of left hip, not elsewhere classified     Problem List Patient Active Problem List   Diagnosis Date Noted  . Osteoarthritis of spine with radiculopathy, lumbar region 11/20/2017  . Radiculitis 10/23/2017  . Fibroid     Malachy Mood  Pearline Cables, PT 04/25/18 12:30 PM   [facility] [address] [place], [place], [address] Phone: [phone]   Fax:  [phone]  Name: Destiny Frost MRN: [id number] Date of Birth: 1961-01-19

## 2018-04-29 ENCOUNTER — Ambulatory Visit: Payer: BLUE CROSS/BLUE SHIELD | Admitting: Physical Therapy

## 2018-04-29 ENCOUNTER — Encounter: Payer: Self-pay | Admitting: Physical Therapy

## 2018-04-29 DIAGNOSIS — M25652 Stiffness of left hip, not elsewhere classified: Secondary | ICD-10-CM | POA: Diagnosis not present

## 2018-04-29 DIAGNOSIS — M25552 Pain in left hip: Secondary | ICD-10-CM | POA: Diagnosis not present

## 2018-04-29 DIAGNOSIS — M6281 Muscle weakness (generalized): Secondary | ICD-10-CM

## 2018-04-29 NOTE — Therapy (Signed)
[facility] [address] [place], [place], [address] Phone: [phone]   Fax:  [phone]  Physical Therapy Treatment  Patient Details  Name: Destiny Frost MRN: [id number] Date of Birth: Sep 22, 1960 Referring Provider (PT): Eustace Moore, MD   Encounter Date: 04/29/2018  PT End of Session - 04/29/18 1211    Visit Number  14    Date for PT Re-Evaluation  05/03/18    Authorization Type  [employer]    PT Start Time  0349    PT Stop Time  1225    PT Time Calculation (min)  40 min    Activity Tolerance  Patient tolerated treatment well;No increased pain    Behavior During Therapy  WFL for tasks assessed/performed       Past Medical History:  Diagnosis Date  . Fibroid   . Seasonal allergies     Past Surgical History:  Procedure Laterality Date  . MOHS SURGERY  2016   SCALP  . TUBAL LIGATION    . WISDOM TOOTH EXTRACTION      There were no vitals filed for this visit.  Subjective Assessment - 04/29/18 1155    Subjective  I have made up a gym at home.     Pertinent History  lumbar surgery 10/18 (cleared from precautions but take it easy)    Limitations  Sitting;Lifting    Patient Stated Goals  regain strength in core, pelvic, for walking and return to normal activities    Currently in Pain?  No/denies         [facility] PT Assessment - 04/29/18 0001      Assessment   Medical Diagnosis  M54.16 (ICD-10-CM) - Radiculopathy, lumbar region    Referring Provider (PT)  Eustace Moore, MD    Onset Date/Surgical Date  02/15/18   back surgery   Prior Therapy  yes      Precautions   Precautions  Back    Precaution Comments  release from back precautions but taking it easy      Restrictions   Weight Bearing Restrictions  No      Home Environment   Living Environment  Private residence    Living Arrangements  Spouse/significant other      Prior Function   Level of Independence  Independent      Cognition    Overall Cognitive Status  Within Functional Limits for tasks assessed      Observation/Other Assessments   Focus on Therapeutic Outcomes (FOTO)   28% limited      AROM   Overall AROM Comments  lumbar ROM       Strength   Strength Assessment Site  Hip    Right/Left Hip  Left    Left Hip Extension  5/5    Left Hip External Rotation  4+/5    Left Hip ABduction  5/5      Ambulation/Gait   Gait Pattern  Within Functional Limits                Pelvic Floor Special Questions - 04/29/18 0001    Strength  weak squeeze, no lift   vaginal       [facility] Adult PT Treatment/Exercise - 04/29/18 0001      Posture/Postural Control   Posture/Postural Control  No significant limitations      Lumbar Exercises: Aerobic   Elliptical  5 min level 1 cross ramp level 1      Lumbar Exercises: Standing  Other Standing Lumbar Exercises  stand on flat side of BOSU ball with keeping balance with eyes open and closed, mini squats on flat side of BOSU      Lumbar Exercises: Seated   Other Seated Lumbar Exercises  sit on blue physioball-3 way shoulder raise 2# 10x2 with abdominal bracing, alternate shoulder and hip flexion holding 2# weight, overhead press, heel slides forward and slide out to the side       Lumbar Exercises: Supine   Dead Bug  15 reps;1 second   stabilization of spine   Dead Bug Limitations  2# weight in hands               PT Short Term Goals - 04/15/18 1152      PT SHORT TERM GOAL #1   Title  able to perform kegel correctly    Time  2    Period  Weeks    Status  Achieved      PT SHORT TERM GOAL #2   Title  ind with initial HEP    Time  2    Period  Weeks    Status  Achieved      PT SHORT TERM GOAL #3   Title  reports only getting up 1x/night at most to void    Time  4    Period  Weeks    Status  Achieved        PT Long Term Goals - 04/29/18 1212      PT LONG TERM GOAL #1   Title  pt will report she is able to have a 30 minute window before  having to get to the bathroom after the initial urge to void    Time  8    Period  Weeks    Status  Achieved      PT LONG TERM GOAL #2   Title  Pt will be able to sustain pelvic floor contraction of 2/5 MMT for at least 5 seconds to control urge to void.     Time  8    Period  Weeks    Status  Achieved      PT LONG TERM GOAL #3   Title  Pt will be able to return to normal walking routine due to improved LE and core strength    Time  8    Period  Weeks    Status  Achieved      PT LONG TERM GOAL #4   Title  Pt will be ind with advanced HEP    Time  8    Period  Weeks    Status  Achieved      PT LONG TERM GOAL #5   Title  FOTO for back will be < or = to 39% limited    Time  8    Period  Weeks    Status  Achieved            Plan - 04/29/18 1211    Clinical Impression Statement  Patient has increased strength of left hip. Patient is independent with her HEP. Patient has increased control with her exercises. Patient walks 2-2.5 miles. Patient has full lumbar ROM. Patient reports no pain. Patient is not lifting more than 15# as per MD for 6 weeks. Patient is ready for discharge to HEP.     Rehab Potential  Excellent    PT Frequency  2x / week    PT Duration  8 weeks  PT Treatment/Interventions  ADLs/Self Care Home Management;Biofeedback;Cryotherapy;Electrical Stimulation;Moist Heat;Therapeutic exercise;Therapeutic activities;Neuromuscular re-education;Patient/family education;Manual techniques;Scar mobilization;Taping;Dry needling    PT Next Visit Plan  Discharge this visit to HEP    PT Home Exercise Plan   Access Code: [email or web address]     Consulted and Agree with Plan of Care  Patient       Patient will benefit from skilled therapeutic intervention in order to improve the following deficits and impairments:  Pain, Abnormal gait, Increased fascial restricitons, Decreased range of motion, Decreased strength, Postural dysfunction  Visit Diagnosis: Muscle weakness  (generalized)  Pain in left hip  Stiffness of left hip, not elsewhere classified     Problem List Patient Active Problem List   Diagnosis Date Noted  . Osteoarthritis of spine with radiculopathy, lumbar region 11/20/2017  . Radiculitis 10/23/2017  . Fibroid     Destiny Frost,Destiny Frost 04/29/2018, 12:32 PM  [facility] [address] [place], [place], [address] Phone: [phone]   Fax:  [phone]  Name: Destiny Frost MRN: [id number] Date of Birth: Jan 29, 1961  PHYSICAL THERAPY DISCHARGE SUMMARY  Visits from Start of Care: 14  Current functional level related to goals / functional outcomes: See above.   Remaining deficits: See above.   Education / Equipment: HEP Plan: Patient agrees to discharge.  Patient goals were met. Patient is being discharged due to meeting the stated rehab goals. Thank you for the referral. Earlie Counts, PT 04/29/18 12:32 PM   ?????

## 2018-05-06 DIAGNOSIS — H43811 Vitreous degeneration, right eye: Secondary | ICD-10-CM | POA: Diagnosis not present

## 2018-05-06 DIAGNOSIS — H43391 Other vitreous opacities, right eye: Secondary | ICD-10-CM | POA: Diagnosis not present

## 2018-05-10 DIAGNOSIS — H53451 Other localized visual field defect, right eye: Secondary | ICD-10-CM | POA: Diagnosis not present

## 2018-09-17 DIAGNOSIS — H524 Presbyopia: Secondary | ICD-10-CM | POA: Diagnosis not present

## 2018-09-17 DIAGNOSIS — H52223 Regular astigmatism, bilateral: Secondary | ICD-10-CM | POA: Diagnosis not present

## 2018-09-17 DIAGNOSIS — H5203 Hypermetropia, bilateral: Secondary | ICD-10-CM | POA: Diagnosis not present

## 2019-01-31 DIAGNOSIS — M2242 Chondromalacia patellae, left knee: Secondary | ICD-10-CM | POA: Diagnosis not present

## 2019-01-31 DIAGNOSIS — M25562 Pain in left knee: Secondary | ICD-10-CM | POA: Diagnosis not present

## 2019-03-06 DIAGNOSIS — D2230 Melanocytic nevi of unspecified part of face: Secondary | ICD-10-CM | POA: Diagnosis not present

## 2019-03-06 DIAGNOSIS — C44519 Basal cell carcinoma of skin of other part of trunk: Secondary | ICD-10-CM | POA: Diagnosis not present

## 2019-03-06 DIAGNOSIS — D225 Melanocytic nevi of trunk: Secondary | ICD-10-CM | POA: Diagnosis not present

## 2019-03-06 DIAGNOSIS — D485 Neoplasm of uncertain behavior of skin: Secondary | ICD-10-CM | POA: Diagnosis not present

## 2019-03-06 DIAGNOSIS — Z85828 Personal history of other malignant neoplasm of skin: Secondary | ICD-10-CM | POA: Diagnosis not present

## 2019-03-06 DIAGNOSIS — L821 Other seborrheic keratosis: Secondary | ICD-10-CM | POA: Diagnosis not present

## 2019-04-08 DIAGNOSIS — C44519 Basal cell carcinoma of skin of other part of trunk: Secondary | ICD-10-CM | POA: Diagnosis not present

## 2019-05-23 IMAGING — XA Imaging study
2 series · 2 of 2 positions shown · non-contrast
Comparison: none

CLINICAL DATA: Lumbosacral spondylosis without myelopathy with
radiculopathy. Left low back/buttock pain radiating down the
posterior left leg. The patient experienced similar symptoms in the
right leg in 7757 and had complete relief following an L5-S1
epidural injection at that time without recurrence of right leg
pain. An MRI at that time demonstrated disc herniations at L4-5 and
L5-S1.

[Series 1: ortho standard · 1 of 1 slices shown (1 of 2)]
[im 1/1]
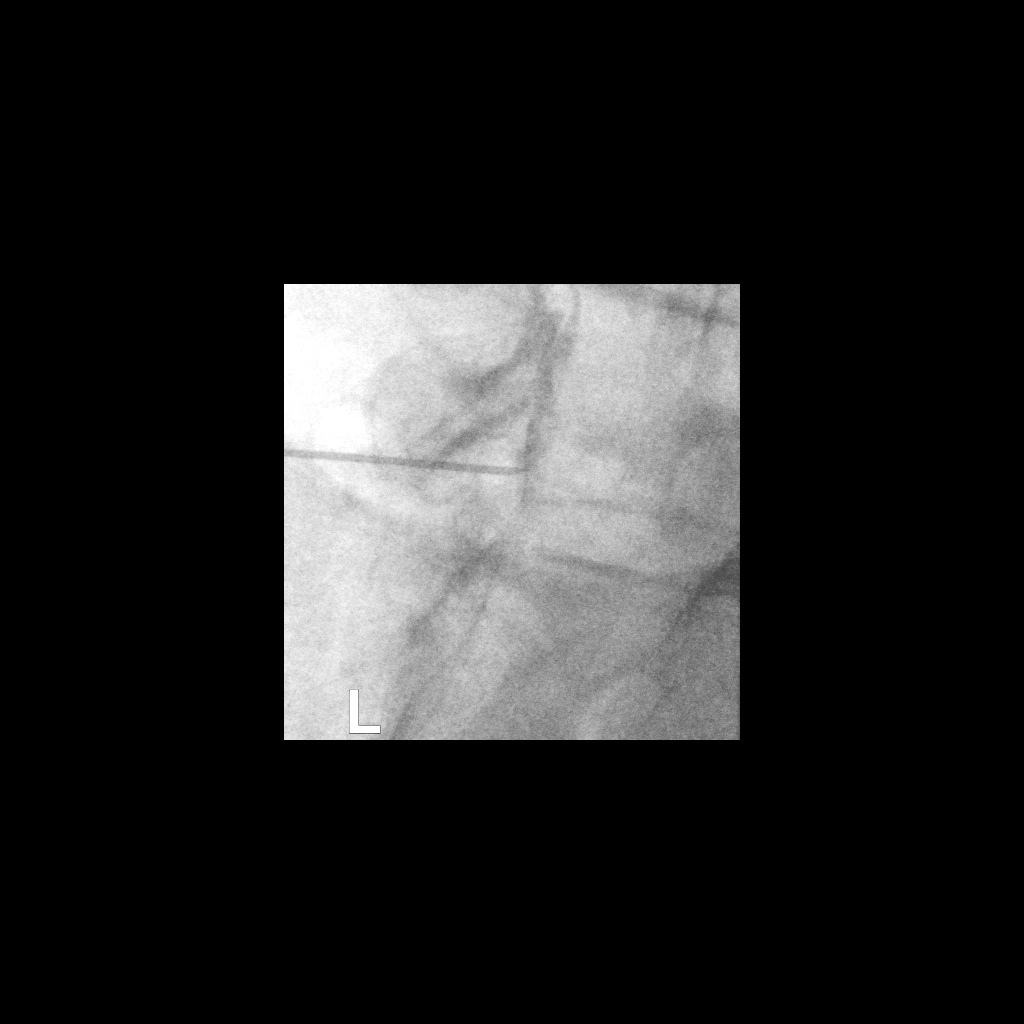

[Series 2: ortho standard · 1 of 1 slices shown (2 of 2)]
[im 1/1]
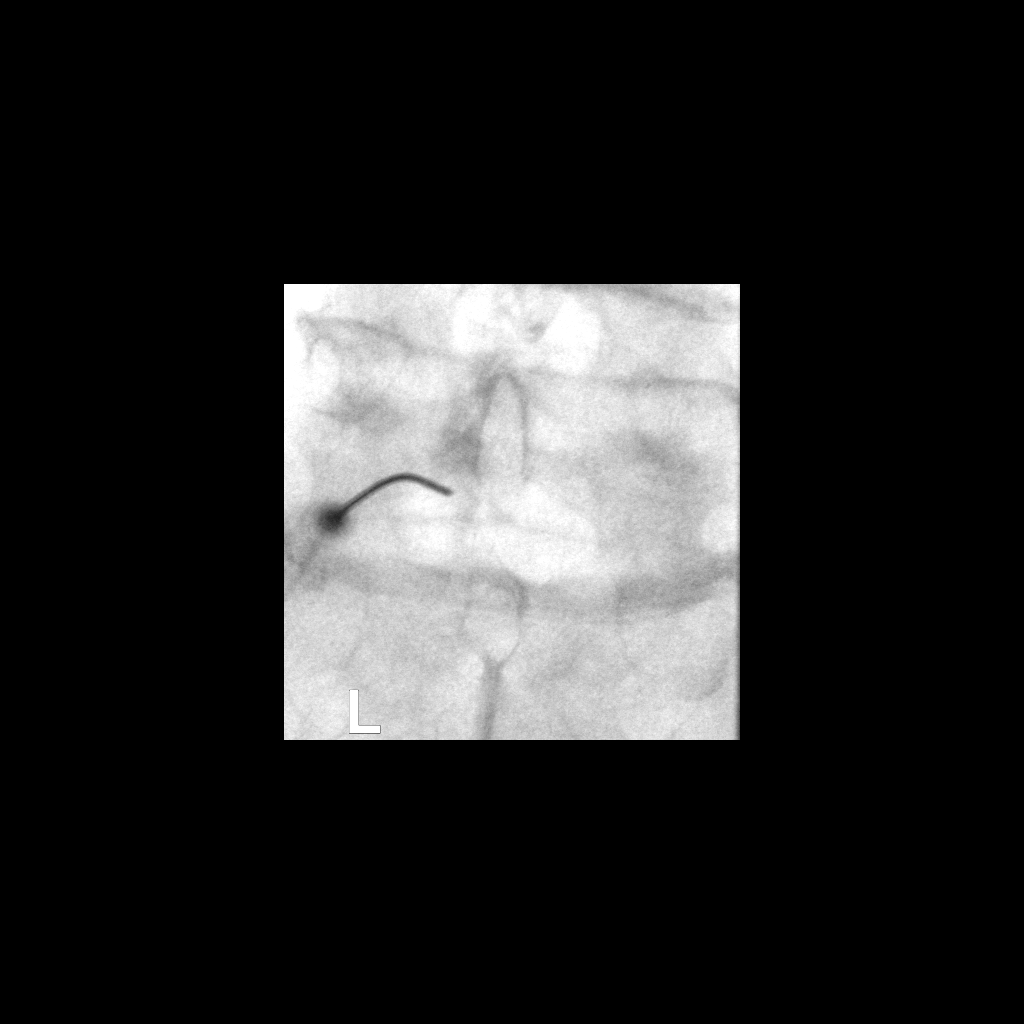

[2 of 2 positions shown; findings below may reference images not displayed]

FLUOROSCOPY TIME:  Radiation Exposure Index (as provided by the
fluoroscopic device): 10.41 microGray*m^2

Fluoroscopy Time (in minutes and seconds):  5 seconds

PROCEDURE:
The procedure, risks, benefits, and alternatives were explained to
the patient. Questions regarding the procedure were encouraged and
answered. The patient understands and consents to the procedure.

LUMBAR EPIDURAL INJECTION:

An interlaminar approach was performed on the left at L5-S1. The
overlying skin was cleansed and anesthetized. A 6 inch 20 gauge
epidural needle was advanced using loss-of-resistance technique.

DIAGNOSTIC EPIDURAL INJECTION:

Injection of Isovue-M 200 shows a good epidural pattern with spread
above and below the level of needle placement, primarily on the
left. No vascular opacification is seen.

THERAPEUTIC EPIDURAL INJECTION:

120 mg of Depo-Medrol mixed with 3 mL of 1% lidocaine were
instilled. The procedure was well-tolerated, and the patient was
discharged thirty minutes following the injection in good condition.
The patient was pain free at the time of discharge.

COMPLICATIONS:
None
IMPRESSION: Technically successful lumbar interlaminar epidural injection on the
left at L5-S1.

## 2019-06-07 IMAGING — MR MR LUMBAR SPINE W/O CM
4 of 5 series · 25 of 48 positions shown · non-contrast
Comparison: Previous MRI from 11/07/2012.

CLINICAL DATA: Initial evaluation for left-sided back pain
radiating into the left lower extremity for 4 months.

EXAM:
MRI LUMBAR SPINE WITHOUT CONTRAST
TECHNIQUE: Multiplanar, multisequence MR imaging of the lumbar spine was
performed. No intravenous contrast was administered.

[Series 3: T2 post-contrast · sagittal · 4.0mm · 0.55mm/px · 6 of 17 slices shown]
[im 1/17]
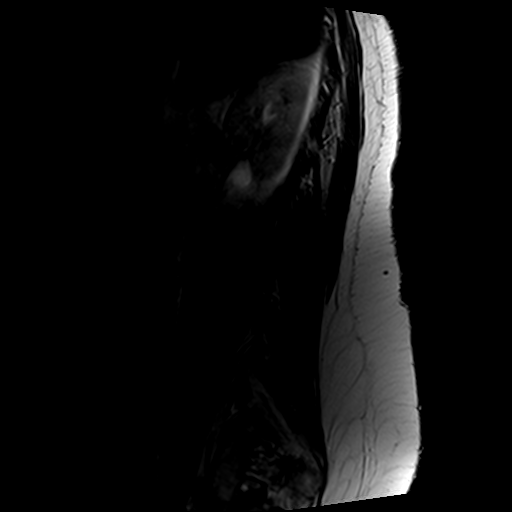
[im 4/17]
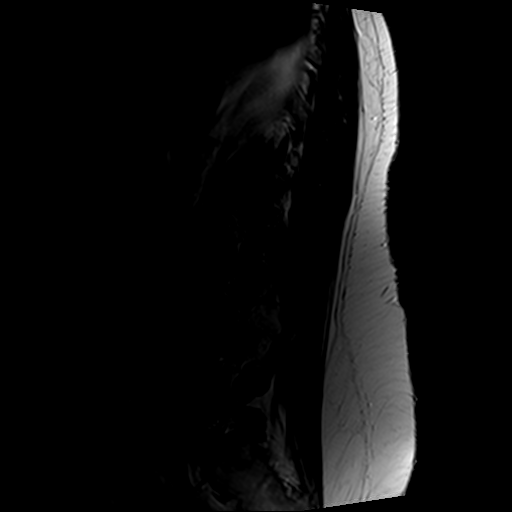
[im 7/17]
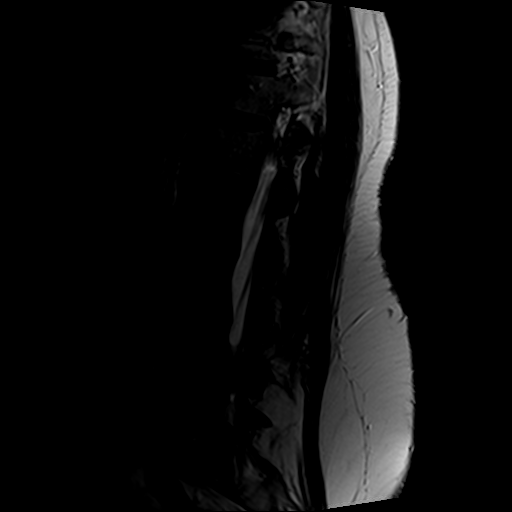
[im 10/17]
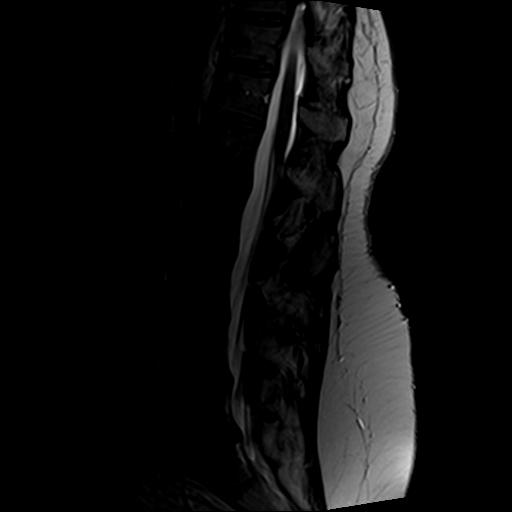
[im 13/17]
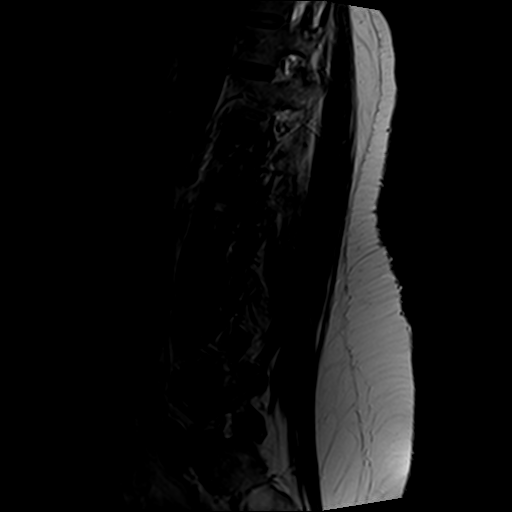
[im 17/17]
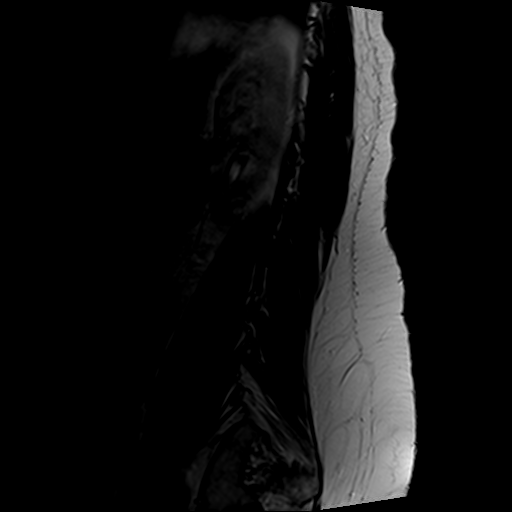

[Series 5: T1 · sagittal · 4.0mm · 0.55mm/px · 6 of 17 slices shown (1 of 2)]
[im 1/17]
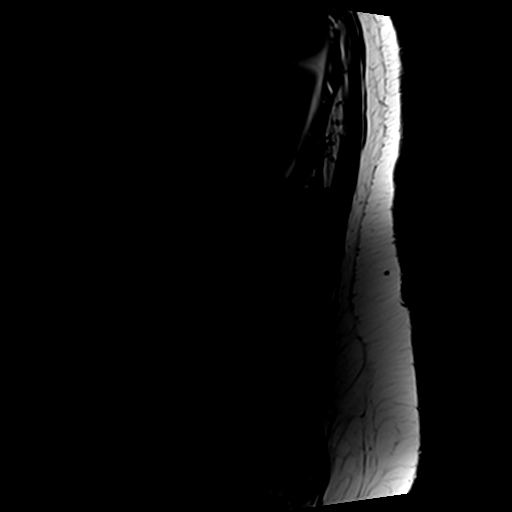
[im 4/17]
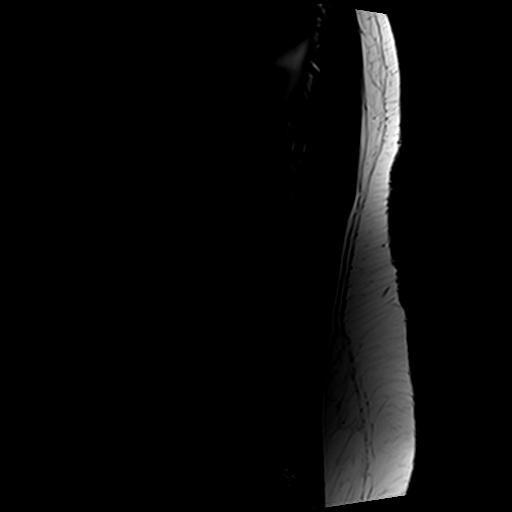
[im 7/17]
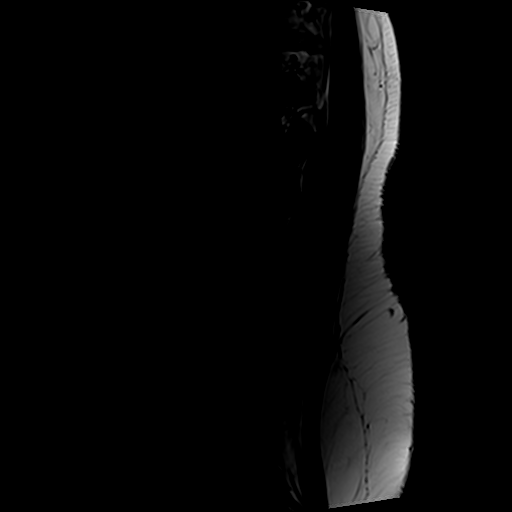
[im 10/17]
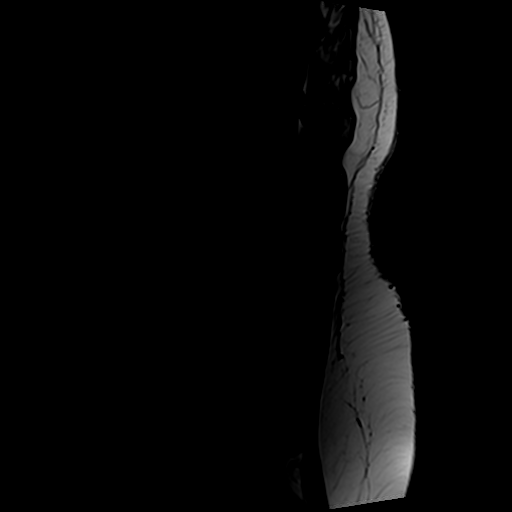
[im 13/17]
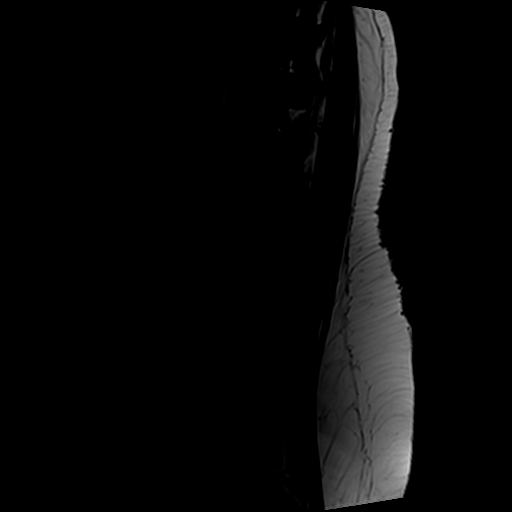
[im 17/17]
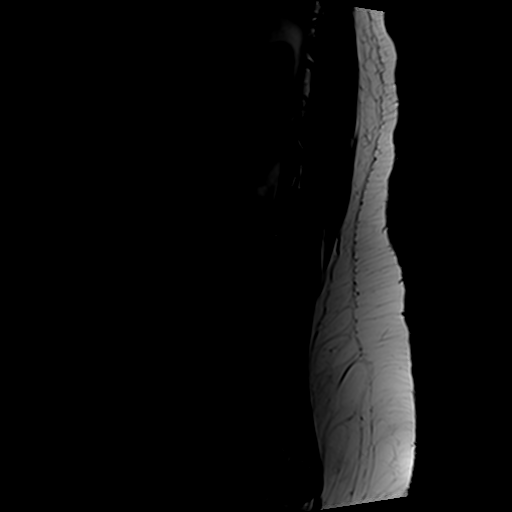

[Series 6: T1 · axial · 4.0mm · 0.35mm/px · z∈[-150,+58]mm · 4 of 43 slices shown (2 of 2)]
[im 1/43]
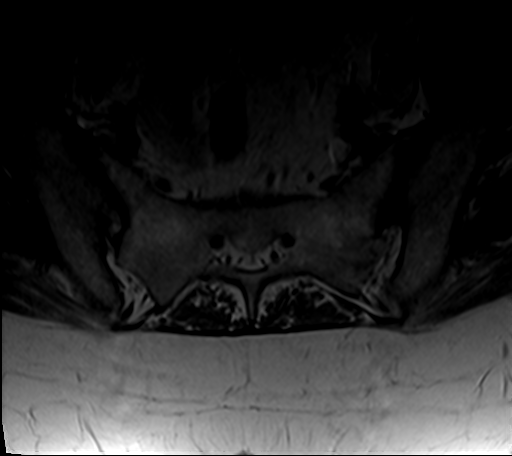
[im 7/43]
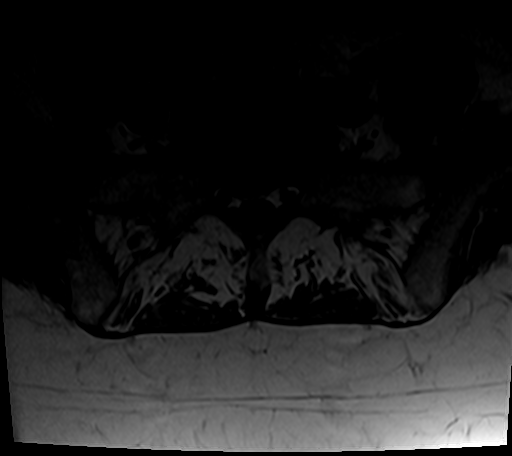
[im 22/43]
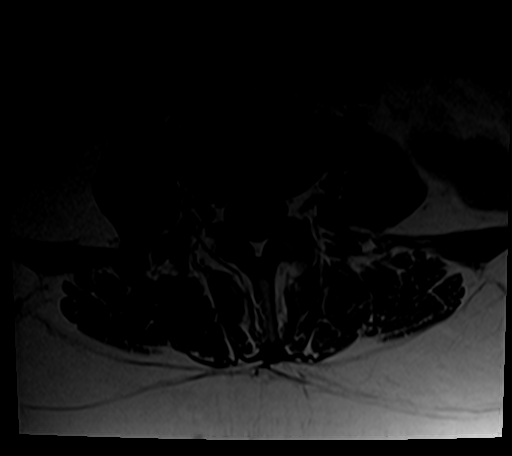
[im 37/43]
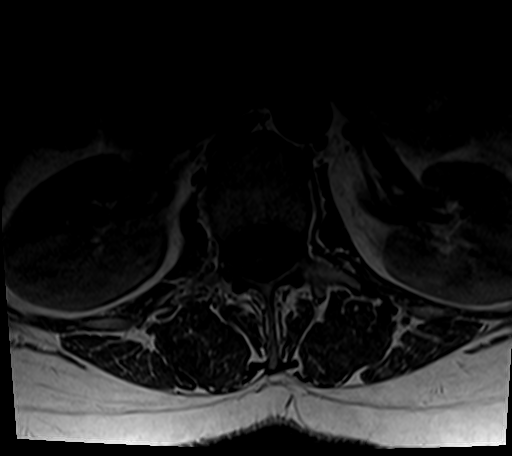

[Series 7: T2 · axial · 4.0mm · 0.70mm/px · z∈[-150,+88]mm · 9 of 43 slices shown]
[im 1/43]
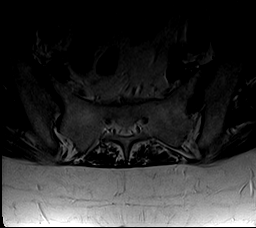
[im 7/43]
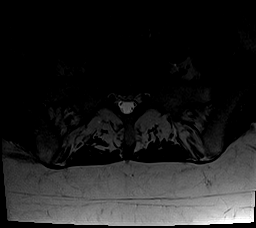
[im 13/43]
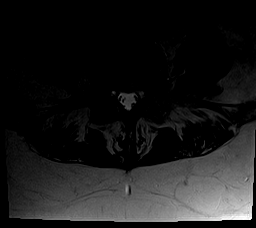
[im 19/43]
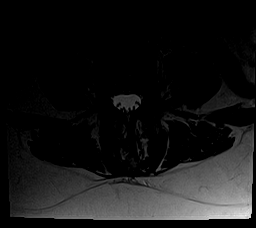
[im 22/43]
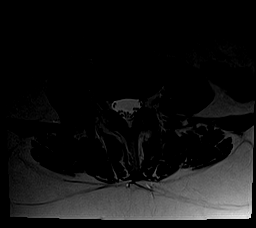
[im 25/43]
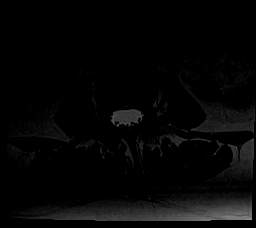
[im 31/43]
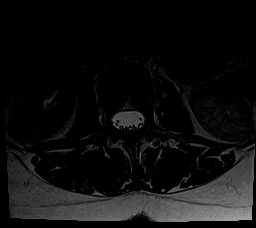
[im 37/43]
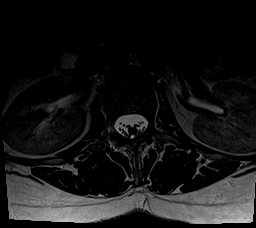
[im 43/43]
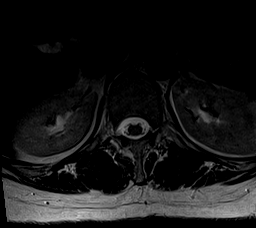

[25 of 48 positions shown; findings below may reference images not displayed]

FINDINGS: Segmentation: Normal segmentation. Lowest well-formed disc labeled
the L5-S1 level.

Alignment: Mild right convex scoliosis. Alignment otherwise normal
with preservation of the normal lumbar lordosis.

Vertebrae: Vertebral body heights maintained without evidence for
acute or chronic fracture. Bone marrow signal intensity within
normal limits. No discrete or worrisome osseous lesions. No abnormal
marrow edema.

Conus medullaris and cauda equina: Conus extends to the L1-2 level.
Conus and cauda equina appear normal.

Paraspinal and other soft tissues: Paraspinous soft tissues within
normal limits. Visualized visceral structures unremarkable.

Disc levels:

L1-2:  Unremarkable.

L2-3:  Unremarkable.

L3-4:  Unremarkable.

L4-5: Mild diffuse disc bulge with disc desiccation. Superimposed
small central disc protrusion mildly indents the ventral thecal sac,
slightly regressed from previous. Mild facet and ligament flavum
hypertrophy. Resultant mild canal with bilateral lateral recess
narrowing, slightly greater on the left. Foramina remain patent.

L5-S1: Chronic intervertebral disc space narrowing with diffuse disc
bulge and disc desiccation. Mild reactive endplate changes.
Superimposed broad central disc protrusion with associated right
subarticular component and inferior migration. Protruding disc
contacts the descending S1 nerve roots bilaterally, with a small
right subarticular component impinging upon the descending right S1
nerve root (series 6, image 36). Resultant moderate bilateral
lateral recess stenosis, right worse than left. Overall, appearance
is relatively unchanged from previous. Foramina remain patent.
IMPRESSION: 1. Central/right subarticular disc protrusion at L5-S1 with
resultant moderate bilateral right lateral recess stenosis, right
worse than left. Either of the descending S1 nerve roots could be
affected. Appearance is similar to previous.
2. Shallow central disc protrusion at L4-5 with resultant mild canal
and bilateral lateral recess stenosis. This disc protrusion has
mildly regressed from previous.

## 2020-01-16 DIAGNOSIS — R7303 Prediabetes: Secondary | ICD-10-CM | POA: Diagnosis not present

## 2020-01-16 DIAGNOSIS — R5383 Other fatigue: Secondary | ICD-10-CM | POA: Diagnosis not present

## 2020-01-16 DIAGNOSIS — E039 Hypothyroidism, unspecified: Secondary | ICD-10-CM | POA: Diagnosis not present

## 2020-01-16 DIAGNOSIS — E559 Vitamin D deficiency, unspecified: Secondary | ICD-10-CM | POA: Diagnosis not present

## 2020-01-16 DIAGNOSIS — N951 Menopausal and female climacteric states: Secondary | ICD-10-CM | POA: Diagnosis not present

## 2020-03-18 DIAGNOSIS — D225 Melanocytic nevi of trunk: Secondary | ICD-10-CM | POA: Diagnosis not present

## 2020-03-18 DIAGNOSIS — L578 Other skin changes due to chronic exposure to nonionizing radiation: Secondary | ICD-10-CM | POA: Diagnosis not present

## 2020-03-18 DIAGNOSIS — B353 Tinea pedis: Secondary | ICD-10-CM | POA: Diagnosis not present

## 2020-03-18 DIAGNOSIS — L219 Seborrheic dermatitis, unspecified: Secondary | ICD-10-CM | POA: Diagnosis not present

## 2020-04-25 ENCOUNTER — Emergency Department (HOSPITAL_COMMUNITY): Payer: BC Managed Care – PPO

## 2020-04-25 ENCOUNTER — Other Ambulatory Visit: Payer: Self-pay

## 2020-04-25 ENCOUNTER — Emergency Department (HOSPITAL_COMMUNITY)
Admission: EM | Admit: 2020-04-25 | Discharge: 2020-04-25 | Disposition: A | Discharge status: Home / Self Care | Payer: BC Managed Care – PPO | Attending: Emergency Medicine | Admitting: Emergency Medicine

## 2020-04-25 ENCOUNTER — Encounter (HOSPITAL_COMMUNITY): Payer: Self-pay

## 2020-04-25 DIAGNOSIS — S99911A Unspecified injury of right ankle, initial encounter: Secondary | ICD-10-CM | POA: Diagnosis present

## 2020-04-25 DIAGNOSIS — S82851A Displaced trimalleolar fracture of right lower leg, initial encounter for closed fracture: Secondary | ICD-10-CM | POA: Insufficient documentation

## 2020-04-25 DIAGNOSIS — W010XXA Fall on same level from slipping, tripping and stumbling without subsequent striking against object, initial encounter: Secondary | ICD-10-CM | POA: Insufficient documentation

## 2020-04-25 LAB — I-STAT CHEM 8, ED
BUN: 20 mg/dL (ref 6–20)
Calcium, Ion: 1.19 mmol/L (ref 1.15–1.40)
Chloride: 105 mmol/L (ref 98–111)
Creatinine, Ser: 0.6 mg/dL (ref 0.44–1.00)
Glucose, Bld: 98 mg/dL (ref 70–99)
HCT: 40 % (ref 36.0–46.0)
Hemoglobin: 13.6 g/dL (ref 12.0–15.0)
Potassium: 3.4 mmol/L — ABNORMAL LOW (ref 3.5–5.1)
Sodium: 142 mmol/L (ref 135–145)
TCO2: 26 mmol/L (ref 22–32)

## 2020-04-25 MED ORDER — MORPHINE SULFATE (PF) 4 MG/ML IV SOLN
4.0000 mg | Freq: Once | INTRAVENOUS | Status: AC
  Administered 2020-04-25: 4 mg via INTRAVENOUS
  Filled 2020-04-25: qty 1

## 2020-04-25 MED ORDER — PROPOFOL 10 MG/ML IV BOLUS
INTRAVENOUS | Status: AC | PRN
Start: 2020-04-25 — End: 2020-04-25

## 2020-04-25 MED ORDER — METHOCARBAMOL 500 MG PO TABS: 500.0000 mg | ORAL_TABLET | Freq: Two times a day (BID) | ORAL | 0 refills | Status: AC

## 2020-04-25 MED ORDER — HYDROCODONE-ACETAMINOPHEN 5-325 MG PO TABS: 1.0000 | ORAL_TABLET | Freq: Four times a day (QID) | ORAL | 0 refills | Status: AC | PRN

## 2020-04-25 MED ORDER — PROPOFOL 10 MG/ML IV BOLUS
0.5000 mg/kg | Freq: Once | INTRAVENOUS | Status: AC
  Administered 2020-04-25: 200 mg via INTRAVENOUS
  Filled 2020-04-25: qty 20

## 2020-04-25 MED ORDER — IBUPROFEN 600 MG PO TABS: 600.0000 mg | ORAL_TABLET | Freq: Four times a day (QID) | ORAL | 0 refills | Status: AC | PRN

## 2020-04-25 MED ORDER — ONDANSETRON HCL 4 MG/2ML IJ SOLN
4.0000 mg | Freq: Once | INTRAMUSCULAR | Status: AC
  Administered 2020-04-25: 4 mg via INTRAVENOUS
  Filled 2020-04-25: qty 2

## 2020-04-25 MED ORDER — KETOROLAC TROMETHAMINE 30 MG/ML IJ SOLN
15.0000 mg | Freq: Once | INTRAMUSCULAR | Status: AC
Start: 2020-04-25 — End: 2020-04-25
  Administered 2020-04-25: 15 mg via INTRAVENOUS
  Filled 2020-04-25: qty 1

## 2020-04-25 NOTE — ED Notes (Signed)
Patient presents to the ED with R ankle injury.   Swelling noted, pulses present, no discoloration present. Neuro intact.   A0x4 at this time   VSS

## 2020-04-25 NOTE — ED Notes (Signed)
Pt returned to floor

## 2020-04-25 NOTE — ED Notes (Signed)
Patient transported to CT 

## 2020-04-25 NOTE — ED Notes (Signed)
1806- timeout with PA Arlean Hopping and MD Pfeiffer at bedside, consents signed.  RT, ortho, NT, PA, MD, this RN present.   Patient on monitor and sedation procedure set up ready. Crash cart outside of room.   1809- prop given-- per MD all 200mg  given in R 20g PIV over period of 15 min.   71062- XR called for confirmation  1823- sedation end, patient A0x4, able to follow all commands.   VSS

## 2020-04-25 NOTE — Discharge Instructions (Signed)
Fracture care There is evidence of a fracture on the x-ray. Pain:  Antiinflammatory medications: Take 600 mg of ibuprofen every 6 hours or 440 mg (over the counter dose) to 500 mg (prescription dose) of naproxen every 12 hours for the next 3 days. After this time, these medications may be used as needed for pain. Take these medications with food to avoid upset stomach. Choose only one of these medications, do not take them together. Acetaminophen (generic for Tylenol): Should you continue to have additional pain while taking the ibuprofen or naproxen, you may add in acetaminophen as needed. Your daily total maximum amount of acetaminophen from all sources should be limited to 4000mg /day for persons without liver problems, or 2000mg /day for those with liver problems. Vicodin: May take Vicodin (hydrocodone-acetaminophen) as needed for severe pain.   Do not drive or perform other dangerous activities while taking this medication as it can cause drowsiness as well as changes in reaction time and judgement.   Please note that each pill of Vicodin contains 325 mg of acetaminophen (generic for Tylenol) and the above dosage limits apply. Methocarbamol: Methocarbamol (generic for Robaxin) is a muscle relaxer and can help relieve stiff muscles or muscle spasms.  Do not drive or perform other dangerous activities while taking this medication as it can cause drowsiness as well as changes in reaction time and judgement. Ice: May apply ice to the injured area for no more than 15 minutes at a time to reduce swelling and pain. Elevation: Keep the extremity elevated whenever possible to reduce swelling and pain. Splint: Keep the splint clean and dry.  Protect it from water during bathing.  If the splint gets wet, you will need to have it reapplied.  Do not leave a wet splint against the skin as this can cause skin breakdown.  Call the orthopedist office or come to the ED for splint replacement, if needed.  Follow-up:  Follow-up with the orthopedic specialist for any further management of this issue.  Call the number provided to set up an appointment.  The plan is for you to be seen in the coming week. Return: Return to the emergency department for severely increased pain, numbness, blanching of the skin, or any other major concerns.

## 2020-04-25 NOTE — ED Notes (Signed)
Patient discharged, vetrbalized understanding of DC instructions.  WC to [place] with husband  4/ 10 pain  PIV removed

## 2020-04-25 NOTE — Progress Notes (Signed)
Orthopedic Tech Progress Note Patient Details:  Destiny Frost 1961-04-17 [id number]  Assisted in application of posterior short leg splint/stirrup with MDs during conscious sedation. Ortho Devices Type of Ortho Device: Post (short leg) splint,Stirrup splint Splint Material: Plaster Ortho Device/Splint Location: Right Lower Extremity Ortho Device/Splint Interventions: Ordered,Application   Post Interventions Patient Tolerated: Well Instructions Provided: Poper ambulation with device,Care of device   [name] P Lorel Monaco 04/25/2020, 6:30 PM

## 2020-04-25 NOTE — ED Triage Notes (Signed)
Patient fell in leaves injuring right ankle. Swelling and pain to same. Positive distal pulses, shoe removed and ice pack applied

## 2020-04-25 NOTE — ED Provider Notes (Signed)
[place] EMERGENCY DEPARTMENT Provider Note   CSN: [id number] Arrival date & time: 04/25/20  1311     History Chief Complaint  Patient presents with  . Ankle Injury    Destiny Frost is a 59 y.o. female.  HPI    Destiny Frost is a 59 y.o. female, with a history of back pain, presenting to the ED with right ankle injury that occurred shortly prior to arrival.  Patient states she lost her footing and slipped, inverting her right ankle and landing with her right leg underneath her. Her pain is moderate to severe, worse with movement or palpation, radiating from the right ankle toward the right knee. Patient's husband states they have an established relationship with [facility] and would like to stay with this practice. Denies numbness, head injury, neck pain, acute back pain, pelvic pain, or any other complaints or injuries.    Past Medical History:  Diagnosis Date  . Fibroid   . Seasonal allergies     Patient Active Problem List   Diagnosis Date Noted  . Osteoarthritis of spine with radiculopathy, lumbar region 11/20/2017  . Radiculitis 10/23/2017  . Fibroid     Past Surgical History:  Procedure Laterality Date  . MOHS SURGERY  2016   SCALP  . TUBAL LIGATION    . WISDOM TOOTH EXTRACTION       OB History    Gravida  4   Para  4   Term      Preterm      AB      Living        SAB      IAB      Ectopic      Multiple      Live Births              Family History  Problem Relation Age of Onset  . Cancer Father        Chronic Lympho Leukemia   . Hypertension Mother   . Skin cancer Mother   . Atrial fibrillation Paternal Grandmother     Social History   Tobacco Use  . Smoking status: Never Smoker  . Smokeless tobacco: Never Used  Substance Use Topics  . Alcohol use: No  . Drug use: No    Home Medications Prior to Admission medications   Medication Sig Start Date End Date Taking? Authorizing Provider   Multiple Vitamin (MULTIVITAMIN) capsule Take 1 capsule by mouth daily.   Yes [provider]  VITAMIN D PO Take 5,000 Units by mouth daily.   Yes [provider]  zinc gluconate 50 MG tablet Take 25 mg by mouth daily.   Yes [provider]  famotidine (PEPCID) 20 MG tablet Take 1 tablet (20 mg total) by mouth 2 (two) times daily. Patient not taking: Reported on 04/25/2020 01/22/18   Briscoe Deutscher, DO  HYDROcodone-acetaminophen (NORCO/VICODIN) 5-325 MG tablet Take 1-2 tablets by mouth every 6 (six) hours as needed for severe pain. 04/25/20   Gervase Colberg C, PA-C  ibuprofen (ADVIL) 600 MG tablet Take 1 tablet (600 mg total) by mouth every 6 (six) hours as needed. 04/25/20   Krishiv Sandler C, PA-C  methocarbamol (ROBAXIN) 500 MG tablet Take 1 tablet (500 mg total) by mouth 2 (two) times daily. 04/25/20   Adewale Pucillo, Helane Gunther, PA-C    Allergies    Patient has no active allergies.  Review of Systems   Review of Systems  Musculoskeletal: Positive for  arthralgias and joint swelling. Negative for back pain and neck pain.  Neurological: Negative for weakness and numbness.    Physical Exam Updated Vital Signs BP (!) 150/80 (BP Location: Left Arm)   Pulse 82   Temp 98.1 F (36.7 C) (Oral)   Resp 18   SpO2 98%   Physical Exam Vitals and nursing note reviewed.  Constitutional:      General: She is not in acute distress.    Appearance: She is well-developed and well-nourished. She is not diaphoretic.  HENT:     Head: Normocephalic and atraumatic.  Eyes:     Conjunctiva/sclera: Conjunctivae normal.  Cardiovascular:     Rate and Rhythm: Normal rate and regular rhythm.     Pulses:          Dorsalis pedis pulses are 2+ on the right side.       Posterior tibial pulses are 2+ on the right side.  Pulmonary:     Effort: Pulmonary effort is normal.  Musculoskeletal:     Cervical back: Neck supple.     Comments: Tenderness and swelling to the right ankle. No tenderness into the  right foot. She does have tenderness along the lateral lower leg extending to the proximal fibula. No tenderness, deformity, swelling, or instability noted to the right knee, right upper leg, or hip. Normal motor function intact in all other extremities. No midline spinal tenderness.   Skin:    General: Skin is warm and dry.     Capillary Refill: Capillary refill takes less than 2 seconds.     Coloration: Skin is not pale.  Neurological:     Mental Status: She is alert.     Comments: Sensation light touch grossly intact in the right foot and toes. Motor function intact in the right toes.  Psychiatric:        Mood and Affect: Mood and affect normal.        Behavior: Behavior normal.     ED Results / Procedures / Treatments   Labs (all labs ordered are listed, but only abnormal results are displayed) Labs Reviewed  I-STAT CHEM 8, ED - Abnormal; Notable for the following components:      Result Value   Potassium 3.4 (*)    All other components within normal limits    EKG None  Radiology DG Tibia/Fibula Right  Result Date: 04/25/2020 CLINICAL DATA:  Known trimalleolar fracture RIGHT ankle, swelling and pain EXAM: RIGHT TIBIA AND FIBULA - 2 VIEW COMPARISON:  04/25/2020 LEFT ankle radiographs FINDINGS: Question osseous demineralization. Again identified displaced trimalleolar fractures of the RIGHT ankle. Persistent posterior dislocation of talus. Intertarsal alignments normal. No additional tibial or fibular fracture identified. Degenerative changes at lateral compartment RIGHT knee. IMPRESSION: Trimalleolar fracture dislocation RIGHT ankle as above. Electronically Signed   By: Lavonia Dana M.D.   On: 04/25/2020 16:20   DG Ankle 2 Views Right  Result Date: 04/25/2020 CLINICAL DATA:  Post reduction EXAM: RIGHT ANKLE - 2 VIEW COMPARISON:  X-ray earlier in the same day FINDINGS: There is improved osseous alignment status post plaster splint placement. Again noted are fractures of the  distal tibia and fibula. There is extensive surrounding soft tissue swelling. IMPRESSION: Improved osseous alignment status post plaster splint placement. Electronically Signed   By: Constance Holster M.D.   On: 04/25/2020 19:00   DG Ankle Complete Right  Result Date: 04/25/2020 CLINICAL DATA:  Right ankle pain, swelling after fall EXAM: RIGHT ANKLE - COMPLETE 3+ VIEW COMPARISON:  None. FINDINGS: Acute trimalleolar fracture of the right ankle. Comminuted oblique fracture of the distal fibular metaphysis with extension to the ankle mortise. Posterolateral displacement and valgus angulation at the fibular fracture site. Transversely oriented fracture through the base of the medial malleolus with anterolateral displacement. Vertically oriented fracture of the posterior malleolus with posterior displacement. Talar dome is posteriorly dislocated relative to the tibial plafond. Diffuse soft tissue swelling at the right ankle. IMPRESSION: Acute trimalleolar fracture-dislocation of the right ankle. Electronically Signed   By: Davina Poke D.O.   On: 04/25/2020 14:17   CT Ankle Right Wo Contrast  Result Date: 04/25/2020 CLINICAL DATA:  Right ankle fracture. EXAM: CT OF THE RIGHT ANKLE WITHOUT CONTRAST TECHNIQUE: Multidetector CT imaging of the right ankle was performed according to the standard protocol. Multiplanar CT image reconstructions were also generated. COMPARISON:  Plain films earlier today. FINDINGS: Bones/Joint/Cartilage Examination demonstrates a displaced comminuted fracture of the distal fibular metaphysis is also displaced corner fracture of the adjacent lateral aspect of the distal tibia at the ankle mortise. There is a displaced slightly comminuted posterior malleolar fracture. Displaced transverse fracture of the base of the medial malleolus. No additional fractures identified. Ligaments Suboptimally assessed by CT. Muscles and Tendons Not accurately evaluated on CT. Soft tissues Diffuse  subcutaneous edema/soft tissue swelling over the ankle. IMPRESSION: 1. Displaced comminuted fractures of the distal fibular metaphysis, posterior malleolus lateral and lateral corner of the distal tibia. Also fracture at the base of the medial malleolus. 2. Diffuse subcutaneous edema/soft tissue swelling over the ankle. Electronically Signed   By: Marin Olp M.D.   On: 04/25/2020 20:05    Procedures Reduction of fracture  Date/Time: 04/25/2020 6:15 PM Performed by: Lorayne Bender, PA-C Authorized by: Lorayne Bender, PA-C  Consent: Written consent obtained. Risks and benefits: risks, benefits and alternatives were discussed Consent given by: patient Patient understanding: patient states understanding of the procedure being performed Patient consent: the patient's understanding of the procedure matches consent given Procedure consent: procedure consent matches procedure scheduled Patient identity confirmed: verbally with patient and provided demographic data Time out: Immediately prior to procedure a "time out" was called to verify the correct patient, procedure, equipment, support staff and site/side marked as required. Local anesthesia used: no  Anesthesia: Local anesthesia used: no  Sedation: Patient sedated: yes Sedation type: moderate (conscious) sedation Sedatives: propofol Analgesia: morphine Vitals: Vital signs were monitored during sedation.  Patient tolerance: patient tolerated the procedure well with no immediate complications Comments: Sedation managed by EDP Dr. Johnney Killian.    (including critical care time)  Medications Ordered in ED Medications  morphine 4 MG/ML injection 4 mg (4 mg Intravenous Given 04/25/20 1544)  ondansetron (ZOFRAN) injection 4 mg (4 mg Intravenous Given 04/25/20 1544)  propofol (DIPRIVAN) 10 mg/mL bolus/IV push 48.8 mg (200 mg Intravenous Given 04/25/20 1825)  morphine 4 MG/ML injection 4 mg (4 mg Intravenous Given 04/25/20 1712)  propofol  (DIPRIVAN) 10 mg/mL bolus/IV push ( Intravenous Canceled Entry 04/25/20 1820)  ketorolac (TORADOL) 30 MG/ML injection 15 mg (15 mg Intravenous Given 04/25/20 2040)    ED Course  I have reviewed the triage vital signs and the nursing notes.  Pertinent labs & imaging results that were available during my care of the patient were reviewed by me and considered in my medical decision making (see chart for details).  Clinical Course as of 04/26/20 0127  Sun Apr 25, 2020  1533 Had secretary call [facility] [SJ]  [id number] Spoke with Dr. Lyla Glassing,  orthopedic specialist on call for [facility]. Requests full tib-fib x-ray.  Reduction with posterior and stirrup splints.  Post reduction x-rays.  Call back after post reduction x-rays to assess need for CT. [SJ]  1904 Spoke with Dr. Lyla Glassing.  He reviewed the postreduction x-ray and states it looks great.  Requests CT of the ankle, then discharge.  Patient can follow-up with Dr. Doran Durand in the office this week.  Call the office on Monday to set up this appointment. [SJ]    Clinical Course User Index [SJ] Linsy Ehresman, Helane Gunther, PA-C   MDM Rules/Calculators/A&P                          Patient presents with right ankle injury. I personally reviewed and interpreted the patient's imaging studies. Trimalleolar fracture noted. Sedation and reduction without immediate complication.  Circulation, motor function, sensation intact before and after splinting. Pain improved after splinting. Patient states she has crutches readily available at home.  She knows she is to be nonweightbearing. Patient and her husband were given instructions for home care as well as return precautions.  Both parties voice understanding of these instructions, accept the plan, and are comfortable with discharge.  Final Clinical Impression(s) / ED Diagnoses Final diagnoses:  Closed trimalleolar fracture of right ankle, initial encounter    Rx / DC Orders ED Discharge Orders         Ordered     HYDROcodone-acetaminophen (NORCO/VICODIN) 5-325 MG tablet  Every 6 hours PRN        04/25/20 1936    ibuprofen (ADVIL) 600 MG tablet  Every 6 hours PRN        04/25/20 1936    methocarbamol (ROBAXIN) 500 MG tablet  2 times daily        04/25/20 1936           Layla Maw 04/26/20 0130    Charlesetta Shanks, MD 04/26/20 1520

## 2020-04-26 NOTE — ED Provider Notes (Signed)
Physical Exam  BP (!) 141/85 (BP Location: Left Arm)   Pulse 71   Temp 98.3 F (36.8 C) (Oral)   Resp 18   Ht 5\' 8"  (1.727 m)   Wt 97.5 kg   SpO2 99%   BMI 32.69 kg/m     ED Course/Procedures   Clinical Course as of 04/26/20 1521  Sun Apr 25, 2020  1533 Had secretary call [facility] [SJ]  [id number] Spoke with Dr. Lyla Glassing, orthopedic specialist on call for [facility]. Requests full tib-fib x-ray.  Reduction with posterior and stirrup splints.  Post reduction x-rays.  Call back after post reduction x-rays to assess need for CT. [SJ]  1904 Spoke with Dr. Lyla Glassing.  He reviewed the postreduction x-ray and states it looks great.  Requests CT of the ankle, then discharge.  Patient can follow-up with Dr. Doran Durand in the office this week.  Call the office on Monday to set up this appointment. [SJ]    Clinical Course User Index [SJ] Joy, Shawn C, PA-C    .Sedation  Date/Time: 04/26/2020 3:21 PM Performed by: Charlesetta Shanks, MD Authorized by: Charlesetta Shanks, MD   Consent:    Consent obtained:  Verbal   Consent given by:  Patient   Risks discussed:  Allergic reaction, dysrhythmia, inadequate sedation, nausea, prolonged hypoxia resulting in organ damage, prolonged sedation necessitating reversal, respiratory compromise necessitating ventilatory assistance and intubation and vomiting   Alternatives discussed:  Analgesia without sedation, anxiolysis and regional anesthesia Universal protocol:    Procedure explained and questions answered to patient or proxy's satisfaction: yes     Relevant documents present and verified: yes     Test results available: yes     Imaging studies available: yes     Required blood products, implants, devices, and special equipment available: yes     Site/side marked: yes     Immediately prior to procedure, a time out was called: yes     Patient identity confirmed:  Verbally with patient Indications:    Procedure necessitating sedation performed by:   Physician performing sedation Pre-sedation assessment:    Time since last food or drink:  12   NPO status caution: urgency dictates proceeding with non-ideal NPO status     ASA classification: class 1 - normal, healthy patient     Mouth opening:  3 or more finger widths   Thyromental distance:  4 finger widths   Mallampati score:  I - soft palate, uvula, fauces, pillars visible   Neck mobility: normal     Pre-sedation assessments completed and reviewed: airway patency, cardiovascular function, hydration status, mental status, nausea/vomiting, pain level, respiratory function and temperature   Immediate pre-procedure details:    Reassessment: Patient reassessed immediately prior to procedure     Reviewed: vital signs, relevant labs/tests and NPO status     Verified: bag valve mask available, emergency equipment available, intubation equipment available, IV patency confirmed, oxygen available and suction available   Procedure details (see MAR for exact dosages):    Preoxygenation:  Nasal cannula   Sedation:  Propofol   Intended level of sedation: deep   Intra-procedure monitoring:  Blood pressure monitoring, cardiac monitor, continuous pulse oximetry, frequent LOC assessments, frequent vital sign checks and continuous capnometry   Intra-procedure events: none     Total Provider sedation time (minutes):  20 Post-procedure details:    Attendance: Constant attendance by certified staff until patient recovered     Recovery: Patient returned to pre-procedure baseline     Post-sedation  assessments completed and reviewed: airway patency, cardiovascular function, hydration status, mental status, nausea/vomiting, pain level, respiratory function and temperature     Patient is stable for discharge or admission: yes     Procedure completion:  Tolerated well, no immediate complications    MDM       Arby Barrette, MD 04/26/20 1523
